# Patient Record
Sex: Female | Born: 1960 | Race: White | Hispanic: No | Marital: Married | Smoking: Current every day smoker
Health system: Southern US, Community
[De-identification: ages and names within clinical notes are randomized; demographics above are authoritative.]

## PROBLEM LIST (undated history)

## (undated) DIAGNOSIS — I2510 Atherosclerotic heart disease of native coronary artery without angina pectoris: Secondary | ICD-10-CM

## (undated) DIAGNOSIS — E039 Hypothyroidism, unspecified: Secondary | ICD-10-CM

## (undated) DIAGNOSIS — I10 Essential (primary) hypertension: Secondary | ICD-10-CM

## (undated) DIAGNOSIS — R112 Nausea with vomiting, unspecified: Secondary | ICD-10-CM

## (undated) DIAGNOSIS — I779 Disorder of arteries and arterioles, unspecified: Secondary | ICD-10-CM

## (undated) DIAGNOSIS — Z9889 Other specified postprocedural states: Secondary | ICD-10-CM

## (undated) DIAGNOSIS — E119 Type 2 diabetes mellitus without complications: Secondary | ICD-10-CM

## (undated) DIAGNOSIS — M1990 Unspecified osteoarthritis, unspecified site: Secondary | ICD-10-CM

## (undated) DIAGNOSIS — I219 Acute myocardial infarction, unspecified: Secondary | ICD-10-CM

## (undated) DIAGNOSIS — I739 Peripheral vascular disease, unspecified: Secondary | ICD-10-CM

---

## 1993-05-24 HISTORY — PX: CHOLECYSTECTOMY: SHX55

## 2000-09-23 ENCOUNTER — Other Ambulatory Visit: Admission: RE | Admit: 2000-09-23 | Discharge: 2000-09-23 | Payer: Self-pay | Admitting: Obstetrics and Gynecology

## 2000-09-30 ENCOUNTER — Other Ambulatory Visit: Admission: RE | Admit: 2000-09-30 | Discharge: 2000-09-30 | Payer: Self-pay | Admitting: Obstetrics and Gynecology

## 2000-11-03 ENCOUNTER — Inpatient Hospital Stay (HOSPITAL_COMMUNITY): Admission: RE | Admit: 2000-11-03 | Discharge: 2000-11-05 | Payer: Self-pay | Admitting: Obstetrics and Gynecology

## 2001-05-24 HISTORY — PX: ABDOMINAL HYSTERECTOMY: SHX81

## 2005-11-18 ENCOUNTER — Emergency Department (HOSPITAL_COMMUNITY): Admission: EM | Admit: 2005-11-18 | Discharge: 2005-11-18 | Payer: Self-pay | Admitting: Emergency Medicine

## 2006-03-29 ENCOUNTER — Emergency Department (HOSPITAL_COMMUNITY): Admission: EM | Admit: 2006-03-29 | Discharge: 2006-03-29 | Payer: Self-pay | Admitting: Emergency Medicine

## 2008-10-21 ENCOUNTER — Ambulatory Visit: Payer: Self-pay | Admitting: Ophthalmology

## 2009-08-20 ENCOUNTER — Encounter: Payer: Self-pay | Admitting: Emergency Medicine

## 2009-08-20 ENCOUNTER — Ambulatory Visit (HOSPITAL_COMMUNITY): Admission: RE | Admit: 2009-08-20 | Discharge: 2009-08-20 | Payer: Self-pay | Admitting: Family Medicine

## 2010-04-30 ENCOUNTER — Inpatient Hospital Stay (HOSPITAL_COMMUNITY): Admission: EM | Admit: 2010-04-30 | Discharge: 2009-08-23 | Payer: Self-pay | Admitting: Neurology

## 2010-05-24 DIAGNOSIS — I219 Acute myocardial infarction, unspecified: Secondary | ICD-10-CM

## 2010-05-24 HISTORY — DX: Acute myocardial infarction, unspecified: I21.9

## 2010-05-24 HISTORY — PX: CARDIAC CATHETERIZATION: SHX172

## 2010-06-13 ENCOUNTER — Encounter: Payer: Self-pay | Admitting: Family Medicine

## 2010-06-14 ENCOUNTER — Encounter: Payer: Self-pay | Admitting: Family Medicine

## 2010-08-12 LAB — GLUCOSE, CAPILLARY
Glucose-Capillary: 220 mg/dL — ABNORMAL HIGH (ref 70–99)
Glucose-Capillary: 293 mg/dL — ABNORMAL HIGH (ref 70–99)
Glucose-Capillary: 308 mg/dL — ABNORMAL HIGH (ref 70–99)

## 2010-08-14 LAB — BASIC METABOLIC PANEL
BUN: 15 mg/dL (ref 6–23)
CO2: 29 mEq/L (ref 19–32)
Chloride: 98 mEq/L (ref 96–112)
Creatinine, Ser: 0.72 mg/dL (ref 0.4–1.2)
Glucose, Bld: 252 mg/dL — ABNORMAL HIGH (ref 70–99)

## 2010-08-17 LAB — FERRITIN: Ferritin: 310 ng/mL — ABNORMAL HIGH (ref 10–291)

## 2010-08-17 LAB — CBC
MCHC: 33.7 g/dL (ref 30.0–36.0)
MCV: 94.7 fL (ref 78.0–100.0)
Platelets: 244 10*3/uL (ref 150–400)
RDW: 13.8 % (ref 11.5–15.5)

## 2010-08-17 LAB — FOLATE RBC: RBC Folate: 1028 ng/mL — ABNORMAL HIGH (ref 180–600)

## 2010-08-17 LAB — DIFFERENTIAL
Basophils Relative: 0 % (ref 0–1)
Eosinophils Absolute: 0.1 10*3/uL (ref 0.0–0.7)
Monocytes Relative: 7 % (ref 3–12)
Neutrophils Relative %: 69 % (ref 43–77)

## 2010-08-17 LAB — BASIC METABOLIC PANEL
BUN: 11 mg/dL (ref 6–23)
BUN: 8 mg/dL (ref 6–23)
CO2: 24 mEq/L (ref 19–32)
Chloride: 100 mEq/L (ref 96–112)
Chloride: 95 mEq/L — ABNORMAL LOW (ref 96–112)
Creatinine, Ser: 0.59 mg/dL (ref 0.4–1.2)
Glucose, Bld: 241 mg/dL — ABNORMAL HIGH (ref 70–99)
Potassium: 3.5 mEq/L (ref 3.5–5.1)

## 2010-08-17 LAB — LIPID PANEL
Cholesterol: 200 mg/dL (ref 0–200)
LDL Cholesterol: 110 mg/dL — ABNORMAL HIGH (ref 0–99)
Triglycerides: 320 mg/dL — ABNORMAL HIGH (ref <150)

## 2010-08-17 LAB — GLUCOSE, CAPILLARY
Glucose-Capillary: 214 mg/dL — ABNORMAL HIGH (ref 70–99)
Glucose-Capillary: 217 mg/dL — ABNORMAL HIGH (ref 70–99)

## 2010-08-17 LAB — PROTIME-INR
INR: 0.99 (ref 0.00–1.49)
Prothrombin Time: 13 seconds (ref 11.6–15.2)

## 2010-08-17 LAB — HEMOGLOBIN A1C
Hgb A1c MFr Bld: 12.4 % — ABNORMAL HIGH (ref 4.6–6.1)
Mean Plasma Glucose: 309 mg/dL

## 2010-08-17 LAB — IRON AND TIBC: TIBC: 279 ug/dL (ref 250–470)

## 2010-08-17 LAB — CARDIAC PANEL(CRET KIN+CKTOT+MB+TROPI): Total CK: 103 U/L (ref 7–177)

## 2011-02-25 ENCOUNTER — Inpatient Hospital Stay (HOSPITAL_COMMUNITY)
Admission: AD | Admit: 2011-02-25 | Discharge: 2011-02-26 | DRG: 853 | Disposition: A | Discharge status: Home / Self Care | Payer: BC Managed Care – PPO | Source: Other Acute Inpatient Hospital | Attending: Interventional Cardiology | Admitting: Interventional Cardiology

## 2011-02-25 DIAGNOSIS — Z79899 Other long term (current) drug therapy: Secondary | ICD-10-CM

## 2011-02-25 DIAGNOSIS — IMO0001 Reserved for inherently not codable concepts without codable children: Secondary | ICD-10-CM | POA: Diagnosis present

## 2011-02-25 DIAGNOSIS — R079 Chest pain, unspecified: Secondary | ICD-10-CM

## 2011-02-25 DIAGNOSIS — E785 Hyperlipidemia, unspecified: Secondary | ICD-10-CM | POA: Diagnosis present

## 2011-02-25 DIAGNOSIS — E039 Hypothyroidism, unspecified: Secondary | ICD-10-CM | POA: Diagnosis present

## 2011-02-25 DIAGNOSIS — F17200 Nicotine dependence, unspecified, uncomplicated: Secondary | ICD-10-CM | POA: Diagnosis present

## 2011-02-25 DIAGNOSIS — Z7982 Long term (current) use of aspirin: Secondary | ICD-10-CM

## 2011-02-25 DIAGNOSIS — E669 Obesity, unspecified: Secondary | ICD-10-CM | POA: Diagnosis present

## 2011-02-25 DIAGNOSIS — I2510 Atherosclerotic heart disease of native coronary artery without angina pectoris: Secondary | ICD-10-CM | POA: Diagnosis present

## 2011-02-25 DIAGNOSIS — I214 Non-ST elevation (NSTEMI) myocardial infarction: Principal | ICD-10-CM | POA: Diagnosis present

## 2011-02-25 LAB — GLUCOSE, CAPILLARY
Glucose-Capillary: 223 mg/dL — ABNORMAL HIGH (ref 70–99)
Glucose-Capillary: 288 mg/dL — ABNORMAL HIGH (ref 70–99)
Glucose-Capillary: 318 mg/dL — ABNORMAL HIGH (ref 70–99)

## 2011-02-25 LAB — CARDIAC PANEL(CRET KIN+CKTOT+MB+TROPI)
CK, MB: 26.4 ng/mL (ref 0.3–4.0)
CK, MB: 17.1 ng/mL (ref 0.3–4.0)
Relative Index: 7.8 — ABNORMAL HIGH (ref 0.0–2.5)
Total CK: 338 U/L — ABNORMAL HIGH (ref 7–177)
Total CK: 250 U/L — ABNORMAL HIGH (ref 7–177)
Troponin I: 11.42 ng/mL (ref <0.30)
Troponin I: 5.85 ng/mL (ref <0.30)

## 2011-02-25 LAB — CBC
HCT: 43.4 % (ref 36.0–46.0)
MCV: 87.3 fL (ref 78.0–100.0)
RDW: 12.6 % (ref 11.5–15.5)
WBC: 11.2 10*3/uL — ABNORMAL HIGH (ref 4.0–10.5)

## 2011-02-25 LAB — APTT: aPTT: 50 seconds — ABNORMAL HIGH (ref 24–37)

## 2011-02-25 LAB — COMPREHENSIVE METABOLIC PANEL
ALT: 36 U/L — ABNORMAL HIGH (ref 0–35)
AST: 46 U/L — ABNORMAL HIGH (ref 0–37)
Alkaline Phosphatase: 110 U/L (ref 39–117)
CO2: 29 mEq/L (ref 19–32)
Chloride: 104 mEq/L (ref 96–112)
Creatinine, Ser: 0.48 mg/dL — ABNORMAL LOW (ref 0.50–1.10)
GFR calc non Af Amer: >90 mL/min (ref >90)
Sodium: 139 mEq/L (ref 135–145)
Total Bilirubin: 0.3 mg/dL (ref 0.3–1.2)

## 2011-02-25 LAB — TSH: TSH: 4.668 u[IU]/mL — ABNORMAL HIGH (ref 0.350–4.500)

## 2011-02-25 LAB — POCT ACTIVATED CLOTTING TIME: Activated Clotting Time: 479 seconds

## 2011-02-25 LAB — LIPID PANEL
Cholesterol: 158 mg/dL (ref 0–200)
Triglycerides: 190 mg/dL — ABNORMAL HIGH (ref <150)

## 2011-02-26 LAB — BASIC METABOLIC PANEL
CO2: 28 mEq/L (ref 19–32)
Calcium: 8.8 mg/dL (ref 8.4–10.5)
Creatinine, Ser: 0.49 mg/dL — ABNORMAL LOW (ref 0.50–1.10)
GFR calc non Af Amer: >90 mL/min (ref >90)
Glucose, Bld: 196 mg/dL — ABNORMAL HIGH (ref 70–99)
Sodium: 139 mEq/L (ref 135–145)

## 2011-02-26 LAB — CBC
Hemoglobin: 15.9 g/dL — ABNORMAL HIGH (ref 12.0–15.0)
MCH: 31.4 pg (ref 26.0–34.0)
MCHC: 35.7 g/dL (ref 30.0–36.0)
MCV: 87.9 fL (ref 78.0–100.0)
Platelets: 207 10*3/uL (ref 150–400)
RBC: 5.06 MIL/uL (ref 3.87–5.11)

## 2011-02-28 NOTE — Cardiovascular Report (Signed)
NAMEKAILANY, DINUNZIO NO.:  [id number]  MEDICAL RECORD NO.:  [id number]  LOCATION:  2807                         FACILITY:  [facility]  PHYSICIAN:  Jake Bathe, MD      DATE OF BIRTH:  08-03-1960  DATE OF PROCEDURE:  02/25/2011 DATE OF DISCHARGE:                           CARDIAC CATHETERIZATION   PROCEDURE:  Left heart catheterization via the right radial artery approach with selective coronary angiography and left ventriculogram.  INDICATIONS:  A 50 year old female diabetic, hypertensive, hyperlipidemic, obese with family history of coronary artery disease, who presented with a non-ST-elevation myocardial infarction with troponin of 11, MB of 29, CK of 375.  LDL-cholesterol 89.  Creatinine is 0.4.  Hemoglobin is 15 with EKG showing nonspecific ST-T wave changes, currently chest pain-free.  She had chest pain intermittently for the past 3 weeks.  Chest x-ray is normal.  PROCEDURE DETAILS:  Informed consent was obtained.  Risk of stroke, heart attack, death, renal impairment, arterial damage, bleeding were explained to the patient.  Questions were answered and alternatives treatments were discussed.  Using the modified Seldinger technique, a 5- French hydrophilic sheath was inserted into the right radial artery without difficulty after 1% lidocaine was used for local anesthesia.  A 3 mg of verapamil was utilized intrasheath and once the catheter was around the arch, a 4000 units of IV heparin was administered.  Judkins right #4 catheter and Judkins left 3.5 catheter were used to selectively cannulate the coronary arteries and an angled pigtail was used to cross into the left ventricle and multiple views with hand injection of Omnipaque were obtained and left ventriculogram was performed with a power injection.  Allen test was normal pre and post procedure.  FINDINGS: 1. Left main artery branches into the LAD and circumflex artery.     There is no  angiographically significant disease present. 2. LAD - this vessel gives rise to one large diagonal branch.  After a     second septal branch, there is approximately 60% narrowing and long     stenosis in this region.  No high-grade stenosis are present. 3. Circumflex artery - there is a 99% stenosis in the proximal     circumflex artery, culprit lesion.  There are collaterals from     right-to-left to this territory.  The distal circumflex     region/obtuse marginal does have some proximal haziness and likely     stenosis as well.  We will reevaluate once stenting has been done. 4. Right coronary artery.  There is proximal vessel disease of     approximately 40-50% in this region gives rise to the PDA, which is     dominant.  There is moderate diffuse disease throughout this     vessel.  Left ventriculogram shows normal EF.  No wall motion     abnormality, no significant mitral vegetation and left ventricular     pressure is 163 with an end-diastolic pressure of 23 and aortic     pressure is 160/87 with a mean of 117 mmHg.  IMPRESSION: 1. Severe proximal circumflex 99% stenosis with right-to-left     collaterals. 2. Moderate diffuse disease elsewhere and vessels  most notably,     approximately 50-60% in the proximal/mid right coronary artery and     mid left anterior descending artery.  Normal left ventricular     ejection fraction.  PLAN:  Plan will be to perform percutaneous intervention with drug- eluting stent to the proximal circumflex.  Findings have been discussed with the patient as well as Dr. Eldridge Dace.     Jake Bathe, MD     MCS/MEDQ  D:  02/25/2011  T:  02/25/2011  Job:  [id number]  Electronically Signed by Donato Schultz MD on 02/28/2011 07:22:53 AM

## 2011-02-28 NOTE — Discharge Summary (Signed)
[name], [name] NO.:  [id number]  MEDICAL RECORD NO.:  [id number]  LOCATION:                                 FACILITY:  PHYSICIAN:  Jake Bathe, MD      DATE OF BIRTH:  [birth year]  DATE OF ADMISSION: DATE OF DISCHARGE:                              DISCHARGE SUMMARY   PRIMARY CARE PHYSICIAN:  Melvyn Novas, MD, in [place], [place].  FINAL DIAGNOSES: 1. Non-ST-elevation myocardial infarction. 2. Coronary artery disease status post drug-eluting stent to the     proximal circumflex artery via the right radial artery approach,     Promus 3.0 x 20-mm stent. 3. Uncontrolled diabetes mellitus.  Random glucoses in the 200-300     range here in the hospital.  Her hemoglobin A1c was 10.6. 4. Obesity. 5. Tobacco use - tobacco cessation counseling given. 6. Hypertension. 7. Hyperlipidemia.  PAST MEDICAL HISTORY: 1. 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 2. Depression.  DISCHARGE MEDICATIONS: 1. Glipizide XL 5 mg once a day (new). 2. Lisinopril 10 mg 1 tablet a day (new). 3. Metoprolol 25 mg twice a day (new). 4. Nitroglycerin 0.4 mg sublingual q.5 minutes chest pain (new). 5. Ticagrelor or Brilinta 90 mg tablet twice a day (new). 6. Aspirin 81 mg a day. 7. Levothyroxine 75 mg 1/2 tablet every morning. 8. Metformin 500 mg twice a day. 9. Paroxetine 20 mg 1/2 tablet a day. 10.Pravachol 40 mg every evening. 11.Vitamin D, multivitamins, and biotin.  PERTINENT LABORATORY DATA:  As above.  TSH 4.6, mildly elevated.  Total cholesterol 158, HDL 31, LDL 89, triglycerides 190.  Creatinine is 0.48. CBC shows a white count of 12.0, hemoglobin 15.9, platelet count 207.  BRIEF HOSPITAL COURSE:  Olivia [name] is a 50 year old female with diabetes - uncontrolled, hypertension, hyperlipidemia who was admitted with stuttering  chest pain over the last 3 weeks which was exertional, relieved with rest and ended up coming to the [facility] Emergency Room because of this chest discomfort.  Cardiac markers were abnormal, demonstrating a positive troponin.  Her peak troponin was 11.4 and her peak CK-MB was 29.2.  She was taken to the cardiac catheterization lab the next morning where she was found to have a 99% proximal circumflex artery stenosis which was successfully stented with a drug-eluting Promus stent.  She also had some moderate diffuse disease elsewhere, approximately 60% mid to distal LAD narrowing, long lesion, and a 40-50% proximal RCA/mid RCA lesion as well.  Her EF appeared normal.  She did very well throughout the hospitalization.  Her blood sugars were elevated.  Hemoglobin A1c was elevated and hence she was started on a new oral antihyperglycemic/glipizide.  Her blood pressure also was mildly elevated during this hospitalization, mostly in the 150s over 80s and both metoprolol as well as lisinopril were begun.  FOLLOWUP:  I have made a followup appointment to see her back in [facility] on Friday, March 05, 2011, at 9:45.  Post cath orders have been given to her.  She understands to  seek medical attention if any worrisome symptoms develop.  I have also instructed her to return to Dr. Janna [name] in [place], [place], for further antihyperglycemic control.     Jake Bathe, MD     MCS/MEDQ  D:  02/26/2011  T:  02/26/2011  Job:  [id number]  cc:   Melvyn Novas, MD  Electronically Signed by Donato Schultz MD on 02/28/2011 07:23:19 AM

## 2011-03-10 NOTE — H&P (Signed)
Olivia [name], [name] NO.:  [id number]  MEDICAL RECORD NO.:  [id number]  LOCATION:  2036                         FACILITY:  [facility]  PHYSICIAN:  Harlon Flor, MD   DATE OF BIRTH:  02-06-1961  DATE OF ADMISSION:  02/25/2011 DATE OF DISCHARGE:                             HISTORY & PHYSICAL   The patient does not have a primary cardiologist.  CHIEF COMPLAINT:  Chest pain.  HISTORY OF PRESENT ILLNESS:  Olivia [name] is a 50 year old white female with type 2 diabetes, tobacco abuse, and hyperlipidemia who is transferred from [facility] with unstable angina.  She presented there after having intermittent chest pain for the last 3 weeks.  Her chest pain is exertional and relieved with rest.  It was relieved with nitroglycerin in the emergency room there tonight.  Due to the typical nature of her symptoms, she was transferred to [facility].  Before the last 3 weeks, she has never had pain or symptoms like this before.  She does have diabetes and she takes metformin for it.  She does continue to smoke 1 pack per day.  She is currently chest pain-free and is on a heparin drip.  She has no further complaints currently.  PAST MEDICAL HISTORY: 1. Diabetes mellitus type 2, on metformin.  Hemoglobin A1c unknown. 2. Hyperlipidemia, on an unknown dose of statin. 3. Hypothyroidism. 4. History of third nerve palsy. 5. History of cholecystectomy. 6. Tobacco abuse. 7. History of hysterectomy.  HOME MEDICATIONS: 1. Metformin 500 b.i.d. 2. Synthroid 37.5 mcg daily. 3. Unknown statin, I suspect pravastatin 40 mg daily.  ALLERGIES:  No known drug allergies.  SOCIAL HISTORY:  The patient works in a factory.  She smokes 1 pack per day and has for years.  She does not drink alcohol or use any drugs.  FAMILY HISTORY:  Her father died in his 58s.  She thinks at age 3 from an acute MI.  She has siblings with diabetes.  REVIEW OF SYSTEMS:  A 14-point review of systems is  negative except as stated in the HPI.  PHYSICAL EXAMINATION:  VITAL SIGNS:  Blood pressure 121/57, pulse 91, respirations 16, temperature 36.9, O2 sat 97% on 2 liters. GENERAL:  No acute distress. HEENT:  Extraocular movements intact.  Oropharynx benign.  Nonicteric sclerae. NECK:  Supple. CARDIOVASCULAR:  Regular rate and rhythm without murmurs, rubs, or gallops.  No jugular venous distention. LUNGS:  Clear to auscultation bilaterally. ABDOMEN:  Soft, nontender, and nondistended.  No hepatosplenomegaly. EXTREMITIES:  No clubbing, cyanosis, or edema.  Pulses 2+ femoral, dorsalis pedis bilaterally. NEURO:  Grossly afocal.  Moves all extremities well.  Alert and oriented x3. SKIN:  No rashes. LYMPH:  No lymphadenopathy.  LABORATORY DATA:  Labs reviewed from the outside hospital.  Hemoglobin 17, white count 9.9, and platelets 230.  BUN 11, creatinine 0.62.  INR is 0.8.  Troponin is 0.10, CK-MB is 3.3.  Her chest x-ray from outside is reported as clear.  Her EKG shows normal sinus rhythm with nonspecific T-wave flattening throughout.  ASSESSMENT AND PLAN:  Olivia [name] is a pleasant 50 year old white female with multiple risk factors for coronary disease including uncontrolled diabetes,  tobacco abuse, and hyperlipidemia.  She is transferred from the [facility] Emergency Room with 3 weeks of intermittent typical anginal symptoms.  Her pain was relieved with nitroglycerin in the ER tonight. 1. Unstable angina:  I have a high suspicion for three-vessel disease.     We will place her on aspirin 325 as well as a heparin drip and     start her on metoprolol 25 mg b.i.d.  We will place her on Lipitor     40 mg daily and plan for left heart catheterization later today. 2. Diabetes mellitus:  She has an unknown A1c, but her blood sugar is     quite elevated now.  We will check her hemoglobin A1c, hold her     metformin.  We will start her on Lantus and sliding scale insulin. 3. We will give her  IV fluids in preparation for heart cath 100 mL/hr.     Harlon Flor, MD     MMB/MEDQ  D:  02/25/2011  T:  02/25/2011  Job:  [id number]  Electronically Signed by Meridee Score MD on 03/10/2011 11:50:08 PM

## 2011-03-22 NOTE — Cardiovascular Report (Signed)
  [name], Olivia [name] NO.:  [id number]  MEDICAL RECORD NO.:  [id number]  LOCATION:  2807                         FACILITY:  [facility]  PHYSICIAN:  Corky Crafts, MDDATE OF BIRTH:  Feb 16, 1961  DATE OF PROCEDURE:  02/25/2011 DATE OF DISCHARGE:                           CARDIAC CATHETERIZATION   PRIMARY CARDIOLOGIST:  Jake Bathe, MD.  PROCEDURES PERFORMED:  PCI of the left circumflex.  OPERATOR:  Corky Crafts, MD.  INDICATIONS:  Non-ST-elevation MI.  PROCEDURE NARRATIVE:  The diagnostic catheterization was done by Dr. Anne Fu.  This revealed 95% proximal circumflex lesion.  Angiomax was used for anticoagulation and EBU 3.0 guiding catheter was used to engage the left main which is the 5-French system.  A Prowater wire was placed across the lesion in the proximal circumflex.  A 2.0 x 15 Emerge balloon was used to predilate the lesion, inflated to 8 atmospheres.  A 3.0 x 20 Promus stent was used to stent the entire diseased area and deployed to 16 atmospheres for 35 seconds.  A 3.25 x 15 [place] Quantum Apex balloon was inflated to 18 atmospheres for two inflations.  After the postdilatation, there is a significant stenosis at the distal edge of the stent which I thought was likely due to vasospasm.  Intracoronary nitroglycerin was administered with successful resolution of the vasospasm.  In the distal obtuse marginal, there is an area of significant stenosis up to 80% but it was a very small branch.  We went back down with the 2.0 x 15 Emerge balloon and inflated this to 4 atmospheres, but there was no significant change in the vessel.  Due to concern for dissecting this vessel, we did not get any more aggressive. Additional nitroglycerin was given through the catheter.  Final pictures were taken.  The initial 95% stenosis was reduced to 0% in the proximal circumflex.  There is 80% stenosis in the distal OM branch, was remained intact.  TIMI 3 flow  maintained throughout the vessel.  IMPRESSION: 1. Non-ST-elevation MI caused by 95% proximal circumflex lesion.  This     was stented with a 3.0 x 20 Promus drug-eluting stent, postdilated     to 3.3 mm in diameter. 2. Distal OM disease in a very small vessel as noted above.  RECOMMENDATIONS:  Continue aspirin 81 mg daily and Brilinta 90 mg b.i.d. We may have to change antiplatelet agents depending on what agent is most easily available to the patient.  Continue aggressive secondary prevention.     Corky Crafts, MD     JSV/MEDQ  D:  02/25/2011  T:  02/25/2011  Job:  [id number]  cc:   Delbert Harness, MD  Electronically Signed by Lance Muss MD on 03/22/2011 01:22:53 PM

## 2012-01-31 ENCOUNTER — Ambulatory Visit (HOSPITAL_COMMUNITY)
Admission: RE | Admit: 2012-01-31 | Discharge: 2012-01-31 | Disposition: A | Discharge status: Home / Self Care | Payer: BC Managed Care – PPO | Source: Ambulatory Visit | Attending: Family Medicine | Admitting: Family Medicine

## 2012-01-31 ENCOUNTER — Other Ambulatory Visit (HOSPITAL_COMMUNITY): Payer: Self-pay | Admitting: Family Medicine

## 2012-01-31 DIAGNOSIS — M1990 Unspecified osteoarthritis, unspecified site: Secondary | ICD-10-CM

## 2012-01-31 DIAGNOSIS — M25559 Pain in unspecified hip: Secondary | ICD-10-CM | POA: Insufficient documentation

## 2012-01-31 DIAGNOSIS — M545 Low back pain, unspecified: Secondary | ICD-10-CM | POA: Insufficient documentation

## 2013-03-30 ENCOUNTER — Telehealth: Payer: Self-pay

## 2013-04-02 NOTE — Telephone Encounter (Signed)
Left message for patient to call the office in regards to refill, patient hasn't been seen in over 2 years.

## 2013-04-10 ENCOUNTER — Telehealth: Payer: Self-pay | Admitting: Cardiology

## 2013-04-10 MED ORDER — METOPROLOL TARTRATE 25 MG PO TABS: 25.0000 mg | ORAL_TABLET | Freq: Two times a day (BID) | ORAL | Status: DC

## 2013-04-10 NOTE — Telephone Encounter (Signed)
Rx filled

## 2013-07-16 ENCOUNTER — Other Ambulatory Visit (HOSPITAL_COMMUNITY): Payer: Self-pay | Admitting: Internal Medicine

## 2013-07-16 DIAGNOSIS — M5416 Radiculopathy, lumbar region: Secondary | ICD-10-CM

## 2013-07-19 ENCOUNTER — Encounter (HOSPITAL_COMMUNITY): Payer: Self-pay

## 2013-07-19 ENCOUNTER — Ambulatory Visit (HOSPITAL_COMMUNITY)
Admission: RE | Admit: 2013-07-19 | Discharge: 2013-07-19 | Disposition: A | Discharge status: Home / Self Care | Payer: BC Managed Care – PPO | Source: Ambulatory Visit | Attending: Internal Medicine | Admitting: Internal Medicine

## 2013-07-19 DIAGNOSIS — M5137 Other intervertebral disc degeneration, lumbosacral region: Secondary | ICD-10-CM | POA: Insufficient documentation

## 2013-07-19 DIAGNOSIS — M47817 Spondylosis without myelopathy or radiculopathy, lumbosacral region: Secondary | ICD-10-CM | POA: Insufficient documentation

## 2013-07-19 DIAGNOSIS — M545 Low back pain, unspecified: Secondary | ICD-10-CM | POA: Insufficient documentation

## 2013-07-19 DIAGNOSIS — M51379 Other intervertebral disc degeneration, lumbosacral region without mention of lumbar back pain or lower extremity pain: Secondary | ICD-10-CM | POA: Insufficient documentation

## 2013-07-19 DIAGNOSIS — M6281 Muscle weakness (generalized): Secondary | ICD-10-CM | POA: Insufficient documentation

## 2013-07-19 DIAGNOSIS — R209 Unspecified disturbances of skin sensation: Secondary | ICD-10-CM | POA: Insufficient documentation

## 2013-07-19 DIAGNOSIS — M5416 Radiculopathy, lumbar region: Secondary | ICD-10-CM

## 2014-05-31 ENCOUNTER — Other Ambulatory Visit (HOSPITAL_COMMUNITY): Payer: Self-pay | Admitting: Neurosurgery

## 2014-05-31 DIAGNOSIS — M4726 Other spondylosis with radiculopathy, lumbar region: Secondary | ICD-10-CM

## 2014-06-07 ENCOUNTER — Ambulatory Visit (HOSPITAL_COMMUNITY)
Admission: RE | Admit: 2014-06-07 | Discharge: 2014-06-07 | Disposition: A | Discharge status: Home / Self Care | Payer: BLUE CROSS/BLUE SHIELD | Source: Ambulatory Visit | Attending: Neurosurgery | Admitting: Neurosurgery

## 2014-06-07 DIAGNOSIS — M79604 Pain in right leg: Secondary | ICD-10-CM | POA: Insufficient documentation

## 2014-06-07 DIAGNOSIS — M4726 Other spondylosis with radiculopathy, lumbar region: Secondary | ICD-10-CM

## 2014-06-07 DIAGNOSIS — M545 Low back pain: Secondary | ICD-10-CM | POA: Diagnosis not present

## 2014-06-07 DIAGNOSIS — G8929 Other chronic pain: Secondary | ICD-10-CM | POA: Insufficient documentation

## 2014-06-13 ENCOUNTER — Other Ambulatory Visit (HOSPITAL_COMMUNITY): Payer: Self-pay | Admitting: Neurosurgery

## 2014-06-18 NOTE — Pre-Procedure Instructions (Addendum)
JOLENE KURAS  06/18/2014   Your procedure is scheduled on:  Thursday, January 28.   Report to [facility] Admitting at 11:30 AM.  Call this number if you have problems the morning of surgery: [phone]   Remember:   Do not eat food or drink liquids after midnight.   Take these medicines the morning of surgery with A SIP OF WATER: levothyroxine (SYNTHROID, LEVOTHROID), metoprolol tartrate (LOPRESSOR).               Stop Aspirin and Vitamins.   Do not wear jewelry, make-up or nail polish.  Do not wear lotions, powders, or perfumes.   Do not shave 48 hours prior to surgery.   Do not bring valuables to the hospital.             [facility] is not responsible for any belongings or valuables.               Contacts, dentures or bridgework may not be worn into surgery.  Leave suitcase in the car. After surgery it may be brought to your room.  For patients admitted to the hospital, discharge time is determined by your treatment team.               Patients discharged the day of surgery will not be allowed to drive home.  Name and phone number of your driver: -   Special Instructions: Review  [facility] - Preparing For Surgery.   Please read over the following fact sheets that you were given: Pain Booklet, Coughing and Deep Breathing and Surgical Site Infection Prevention

## 2014-06-18 NOTE — Progress Notes (Addendum)
Anesthesia PAT Evaluation:  Patient is a 54 year old female scheduled for right L5-S1 lateral microdiscectomy on 06/20/14 by Dr. Franky Macho.  PAT was this morning.  History includes CAD/NSTEMI s/p DES proximal CX 02/25/11, DM2, HTN, HLD, smoking (1ppd since age 49), depression, occluded left ICA and right carotid cavernous fistula (felt responsible for right sided ophthalmoplegia 08/21/09; a-gram ordered by neurologist Dr. Lesia Sago, but patient denies on-going neurology or carotid duplex follow-up.  Denies history of CVA). BMI is consistent with obesity.  PCP is Dr. Catalina Pizza whom she see ~ every three months.  She lives in [place]. She saw cardiologists Dr. Anne Fu with intervention by Dr. Eldridge Dace during her 02/2011 hospitalization. She has not seen Dr. Anne Fu since ~ 2013.  Meds include ASA (on hold), glipizide, metformin, metoprolol, pravastatin, Januvia.  06/19/14 EKG: NSR, possible LAE, cannot rule out anterior infarct (age undetermined).  Overall, I think her EKG appears stable when compared to tracing from 02/26/11.  02/28/11 Cardiac cath: IMPRESSION: 1. Severe proximal circumflex 99% stenosis with right-to-left  collaterals. 2. Moderate diffuse disease elsewhere and vessels most notably,  approximately 50-60% in the proximal/mid right coronary artery and  mid left anterior descending artery. Normal left ventricular  ejection fraction. Intervention: Proximal circumflex: 3.0 x 20 Promus drug-eluting stent, pos-tdilated to 3.3 mm in diameter (0% residual). 80% distal OM disease not intervened due to being a very small branch.  Preoperative labs noted.   I evaluated patient during her PAT visit. She denies SOB, chest pain, palpitations, syncope.  She has chronic LE edema, worse with prolonged standing.  Her primary symptom for her MI in 2012 was a feeling like an elephant was sitting on her chest. She denied any recurrent similar symptoms. She does continue to smoke  Her fasting  glucose readings typically run ~ 180-220.  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  She is on her feet during work and reports her work involves a lot of walking and even climbing up to three flights of stairs.  She has to move slowly and rest every few steps due to back and leg pain and numbness (RLE > LLE).  It takes her ~ 9 minutes to go up a flight of stairs at work.  Work is where she is most active currently.  She fell at home ~ 3 years ago, but activity has been more limited for ~ 7-12 months.    Exam: Vitals reviewed.  Heart RRR, no murmur noted.  Lungs clear.  I did hear bilateral carotid bruits, L > R. Mild - moderate generalized pre-tibial edema noted (stable per patient).    I reviewed above with anesthesiologist Dr. Glade Stanford.  Patient has had no CV symptoms and her EKG is stable.  However, she has known CAD with CX stent and moderate LAD and RCA disease from cath in 2012, as well as other risk factor including DM2, smoking, obesity, and carotid artery disease. She has not hade any recent cardiology testing or follow-up. She does remain on b-blocker, statin, and ASA therapies, although ASA is currently on hold.  She does continue to work 12 hour shifts which is reassuring, but her walking is in shorts spurts, so it's somewhat difficult to gauge her METS level. Dr. Sampson Goon recommended preoperative cardiology evaluation. I discussed this with patient who was quite emotional about the prospect of having to delay her surgery in order to see cardiology because of the amount of pain she has  been having. She also had to go thru a bit of trouble getting work coverage. I advised her that Darl Pikes at Dr. Sueanne Margarita office was trying to get her an urgent cardiology appointment and would contact her regarding appointment date/time. Defer any decision regarding pre-operative cardiac testing to cardiology, but she will likely need follow-up carotid studies in the near  future.    (Update:  Darl Pikes was able to get patient an appointment with Dr. Purvis Sheffield tomorrow at 8:40 AM at the [facility] office.  She is planning to have patient stay NPO after midnight and if cleared have her come to the hospital for surgery in the afternoon.  If further testing is recommended and cannot be performed prior to surgery then her procedure will be postponed.  She will contact patient with this information.)  Velna Ochs [facility] Stay [facility]/Anesthesiology Phone [phone] 06/19/2014 12:10 PM

## 2014-06-19 ENCOUNTER — Encounter (HOSPITAL_COMMUNITY)
Admission: RE | Admit: 2014-06-19 | Discharge: 2014-06-19 | Disposition: A | Discharge status: Home / Self Care | Payer: BLUE CROSS/BLUE SHIELD | Source: Ambulatory Visit | Attending: Neurosurgery | Admitting: Neurosurgery

## 2014-06-19 ENCOUNTER — Encounter (HOSPITAL_COMMUNITY): Payer: Self-pay

## 2014-06-19 DIAGNOSIS — E119 Type 2 diabetes mellitus without complications: Secondary | ICD-10-CM | POA: Diagnosis not present

## 2014-06-19 DIAGNOSIS — Z01810 Encounter for preprocedural cardiovascular examination: Secondary | ICD-10-CM | POA: Diagnosis present

## 2014-06-19 DIAGNOSIS — I2510 Atherosclerotic heart disease of native coronary artery without angina pectoris: Secondary | ICD-10-CM | POA: Diagnosis not present

## 2014-06-19 DIAGNOSIS — Z7982 Long term (current) use of aspirin: Secondary | ICD-10-CM | POA: Diagnosis not present

## 2014-06-19 HISTORY — DX: Type 2 diabetes mellitus without complications: E11.9

## 2014-06-19 HISTORY — DX: Disorder of arteries and arterioles, unspecified: I77.9

## 2014-06-19 HISTORY — DX: Acute myocardial infarction, unspecified: I21.9

## 2014-06-19 HISTORY — DX: Atherosclerotic heart disease of native coronary artery without angina pectoris: I25.10

## 2014-06-19 HISTORY — DX: Peripheral vascular disease, unspecified: I73.9

## 2014-06-19 HISTORY — DX: Essential (primary) hypertension: I10

## 2014-06-19 HISTORY — DX: Unspecified osteoarthritis, unspecified site: M19.90

## 2014-06-19 LAB — BASIC METABOLIC PANEL
ANION GAP: 11 (ref 5–15)
BUN: 8 mg/dL (ref 6–23)
CO2: 25 mmol/L (ref 19–32)
Calcium: 9.2 mg/dL (ref 8.4–10.5)
Chloride: 102 mmol/L (ref 96–112)
Creatinine, Ser: 0.57 mg/dL (ref 0.50–1.10)
GFR calc Af Amer: >90 mL/min (ref >90)
GFR calc non Af Amer: >90 mL/min (ref >90)
GLUCOSE: 242 mg/dL — AB (ref 70–99)
POTASSIUM: 4.1 mmol/L (ref 3.5–5.1)
SODIUM: 138 mmol/L (ref 135–145)

## 2014-06-19 LAB — GLUCOSE, CAPILLARY: Glucose-Capillary: 211 mg/dL — ABNORMAL HIGH (ref 70–99)

## 2014-06-19 LAB — CBC
HCT: 47 % — ABNORMAL HIGH (ref 36.0–46.0)
HEMOGLOBIN: 16.3 g/dL — AB (ref 12.0–15.0)
MCH: 30.2 pg (ref 26.0–34.0)
MCHC: 34.7 g/dL (ref 30.0–36.0)
MCV: 87.2 fL (ref 78.0–100.0)
PLATELETS: 246 10*3/uL (ref 150–400)
RBC: 5.39 MIL/uL — AB (ref 3.87–5.11)
RDW: 13.4 % (ref 11.5–15.5)
WBC: 10.2 10*3/uL (ref 4.0–10.5)

## 2014-06-19 LAB — HEMOGLOBIN A1C
Hgb A1c MFr Bld: 9.9 % — ABNORMAL HIGH (ref <5.7)
MEAN PLASMA GLUCOSE: 237 mg/dL — AB (ref <117)

## 2014-06-19 LAB — SURGICAL PCR SCREEN
MRSA, PCR: NEGATIVE
STAPHYLOCOCCUS AUREUS: NEGATIVE

## 2014-06-19 MED ORDER — CEFAZOLIN SODIUM-DEXTROSE 2-3 GM-% IV SOLR: 2.0000 g | INTRAVENOUS | Status: DC

## 2014-06-19 NOTE — Progress Notes (Signed)
   06/19/14 0927  OBSTRUCTIVE SLEEP APNEA  Have you ever been diagnosed with sleep apnea through a sleep study? No  Do you snore loudly (loud enough to be heard through closed doors)?  1  Do you often feel tired, fatigued, or sleepy during the daytime? 1  Has anyone observed you stop breathing during your sleep? 0  Do you have, or are you being treated for high blood pressure? 1  BMI more than 35 kg/m2? 0  Age over 55 years old? 1  Neck circumference greater than 40 cm/16 inches? 0  Gender: 0  Obstructive Sleep Apnea Score 4  Score 4 or greater  Results sent to PCP

## 2014-06-19 NOTE — Progress Notes (Signed)
PCP: Dr. Catalina Pizza  Pt. States she does not have a cardiologist. Last seen by Dr. Anne Fu 2012.  Loleta Books, PA notified and will see pt. Marland Kitchen

## 2014-06-19 NOTE — Progress Notes (Signed)
Left message for Dr. Franky Macho to sign orders in epic, spoke to [place].

## 2014-06-20 ENCOUNTER — Encounter: Payer: Self-pay | Admitting: *Deleted

## 2014-06-20 ENCOUNTER — Ambulatory Visit (INDEPENDENT_AMBULATORY_CARE_PROVIDER_SITE_OTHER): Payer: BLUE CROSS/BLUE SHIELD | Admitting: Cardiovascular Disease

## 2014-06-20 ENCOUNTER — Telehealth: Payer: Self-pay | Admitting: Cardiovascular Disease

## 2014-06-20 ENCOUNTER — Ambulatory Visit (HOSPITAL_COMMUNITY): Admission: RE | Admit: 2014-06-20 | Payer: BLUE CROSS/BLUE SHIELD | Source: Ambulatory Visit | Admitting: Neurosurgery

## 2014-06-20 ENCOUNTER — Encounter (HOSPITAL_COMMUNITY): Admission: RE | Payer: Self-pay | Source: Ambulatory Visit

## 2014-06-20 ENCOUNTER — Encounter: Payer: Self-pay | Admitting: Cardiovascular Disease

## 2014-06-20 VITALS — BP 164/84 | HR 96 | Ht 63.0 in | Wt 197.0 lb

## 2014-06-20 DIAGNOSIS — Z720 Tobacco use: Secondary | ICD-10-CM

## 2014-06-20 DIAGNOSIS — I2510 Atherosclerotic heart disease of native coronary artery without angina pectoris: Secondary | ICD-10-CM

## 2014-06-20 DIAGNOSIS — E1151 Type 2 diabetes mellitus with diabetic peripheral angiopathy without gangrene: Secondary | ICD-10-CM

## 2014-06-20 DIAGNOSIS — I2583 Coronary atherosclerosis due to lipid rich plaque: Secondary | ICD-10-CM

## 2014-06-20 DIAGNOSIS — Z01818 Encounter for other preprocedural examination: Secondary | ICD-10-CM

## 2014-06-20 DIAGNOSIS — I10 Essential (primary) hypertension: Secondary | ICD-10-CM

## 2014-06-20 DIAGNOSIS — E785 Hyperlipidemia, unspecified: Secondary | ICD-10-CM

## 2014-06-20 DIAGNOSIS — E1165 Type 2 diabetes mellitus with hyperglycemia: Secondary | ICD-10-CM

## 2014-06-20 DIAGNOSIS — IMO0002 Reserved for concepts with insufficient information to code with codable children: Secondary | ICD-10-CM

## 2014-06-20 DIAGNOSIS — E1159 Type 2 diabetes mellitus with other circulatory complications: Secondary | ICD-10-CM

## 2014-06-20 DIAGNOSIS — Z955 Presence of coronary angioplasty implant and graft: Secondary | ICD-10-CM

## 2014-06-20 DIAGNOSIS — I252 Old myocardial infarction: Secondary | ICD-10-CM

## 2014-06-20 SURGERY — LUMBAR LAMINECTOMY/DECOMPRESSION MICRODISCECTOMY 1 LEVEL
Anesthesia: General | Laterality: Right

## 2014-06-20 NOTE — Telephone Encounter (Signed)
Lexiscan scheduled for 06-21-14 at [facility]. Checking percert

## 2014-06-20 NOTE — Telephone Encounter (Signed)
Auth # [id number] exp 07/19/14

## 2014-06-20 NOTE — Patient Instructions (Signed)
Your physician has requested that you have a lexiscan myoview. For further information please visit [email or web address]. Please follow instruction sheet, as given. Office will contact with results via phone or letter.   Continue all current medications. Your physician wants you to follow up in: 6 months.  You will receive a reminder letter in the mail one-two months in advance.  If you don't receive a letter, please call our office to schedule the follow up appointment    

## 2014-06-20 NOTE — Progress Notes (Signed)
Patient ID: Olivia [name], female   DOB: 1961-04-03, 54 y.o.   MRN: [id number]       CARDIOLOGY CONSULT NOTE  Patient ID: Olivia [name] MRN: [id number] DOB/AGE: May 27, 1960 54 y.o.  Admit date: (Not on file) Primary Physician Catalina Pizza, MD  Reason for Consultation: pre-op risk stratification, CAD  HPI:  The patient is a 54 year old woman who is scheduled for right L5-S1 lateral microdiscectomy later this afternoon by Dr. Franky Macho. She was evaluated in the preoperative setting yesterday by anesthesiology and it was determined she needed an urgent cardiac consultation. She has a history of coronary artery disease and a non-STEMI and underwent drug-eluting stent placement to the proximal left circumflex coronary artery on 02/25/2011. She also has a medical history significant for type 2 diabetes mellitus, hypertension, hyperlipidemia, peripheral vascular disease with an occluded left internal carotid artery, and ongoing tobacco abuse. She has not followed up with cardiology in the past few years. She said she walks a significant amount at work going up and down stairs and denies exertional chest pain, shortness of breath, and dizziness. She also denies palpitations, syncope, orthopnea, paroxysmal nocturnal dyspnea. She hahad back pain for the past 2 years. She stopped taking her aspirin last Friday in preparation for surgery. She takes metoprolol and pravastatin as well. A recent ECG demonstrated normal sinus rhythm with no significant ischemic ST segment or T-wave abnormalities.  Coronary angiography on 02/25/2011 demonstrated 60% narrowing of the LAD beyond the second septal branch. She had a 99% proximal left circumflex coronary artery stenosis which was deemed the culprit vessel at the time of her MI. The right coronary artery had a proximal 40-60% stenosis. Left ventriculography demonstrated normal ejection fraction and wall motion with no significant mitral regurgitation and elevated  end-diastolic pressure of 23 mmHg.    No Known Allergies  Current Outpatient Prescriptions  Medication Sig Dispense Refill  . cholecalciferol (VITAMIN D) 1000 UNITS tablet Take 1,000 Units by mouth daily.    Marland Kitchen glipiZIDE (GLUCOTROL) 10 MG tablet Take 10 mg by mouth daily before breakfast.    . levothyroxine (SYNTHROID, LEVOTHROID) 25 MCG tablet Take 25 mcg by mouth daily before breakfast.    . metFORMIN (GLUCOPHAGE) 1000 MG tablet Take 1,000 mg by mouth 2 (two) times daily with a meal.    . metoprolol tartrate (LOPRESSOR) 25 MG tablet Take 1 tablet (25 mg total) by mouth 2 (two) times daily. 60 tablet 5  . pravastatin (PRAVACHOL) 40 MG tablet Take 40 mg by mouth daily.    . sitaGLIPtin (JANUVIA) 100 MG tablet Take 100 mg by mouth daily.    . vitamin E 400 UNIT capsule Take 400 Units by mouth daily.    Marland Kitchen aspirin EC 81 MG tablet Take 81 mg by mouth daily.     No current facility-administered medications for this visit.   Facility-Administered Medications Ordered in Other Visits  Medication Dose Route Frequency Provider Last Rate Last Dose  . ceFAZolin (ANCEF) IVPB 2 g/50 mL premix  2 g Intravenous On Call to OR Coletta Memos, MD        Past Medical History  Diagnosis Date  . Myocardial infarction 2012  . Hypertension   . Diabetes mellitus without complication   . Arthritis     back  . 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 Coronary artery disease     Past Surgical History  Procedure Laterality Date  .  Abdominal hysterectomy  2003    partial  . Cholecystectomy  1995  . Cardiac catheterization  2012    DES pCX 02/25/11    History   Social History  . Marital Status: Married    Spouse Name: N/A    Number of Children: N/A  . Years of Education: N/A   Occupational History  . Not on file.   Social History Main Topics  . Smoking status: Current Every Day Smoker -- 1.00 packs/day for 30 years    Types:  Cigarettes    Start date: 01/03/1976  . Smokeless tobacco: Never Used  . Alcohol Use: No  . Drug Use: No  . Sexual Activity: Not on file   Other Topics Concern  . Not on file   Social History Narrative     No family history of premature CAD in 1st degree relatives.  Prior to Admission medications   Medication Sig Start Date End Date Taking? Authorizing Provider  cholecalciferol (VITAMIN D) 1000 UNITS tablet Take 1,000 Units by mouth daily.   Yes Historical Provider, MD  glipiZIDE (GLUCOTROL) 10 MG tablet Take 10 mg by mouth daily before breakfast.   Yes Historical Provider, MD  levothyroxine (SYNTHROID, LEVOTHROID) 25 MCG tablet Take 25 mcg by mouth daily before breakfast.   Yes Historical Provider, MD  metFORMIN (GLUCOPHAGE) 1000 MG tablet Take 1,000 mg by mouth 2 (two) times daily with a meal.   Yes Historical Provider, MD  metoprolol tartrate (LOPRESSOR) 25 MG tablet Take 1 tablet (25 mg total) by mouth 2 (two) times daily. 04/10/13  Yes Donato Schultz, MD  pravastatin (PRAVACHOL) 40 MG tablet Take 40 mg by mouth daily.   Yes Historical Provider, MD  sitaGLIPtin (JANUVIA) 100 MG tablet Take 100 mg by mouth daily.   Yes Historical Provider, MD  vitamin E 400 UNIT capsule Take 400 Units by mouth daily.   Yes Historical Provider, MD  aspirin EC 81 MG tablet Take 81 mg by mouth daily.    Historical Provider, MD     Review of systems complete and found to be negative unless listed above in HPI     Physical exam Blood pressure 164/84, pulse 96, height  (1.6 m), weight 197 lb (89.359 kg). General: NAD, obese Neck: No JVD, no thyromegaly or thyroid nodule.  Lungs: Clear to auscultation bilaterally with normal respiratory effort. CV: Nondisplaced PMI. Regular rate and rhythm, normal S1/S2, no S3/S4, soft 1/6 systolic murmur over RUSB and LUSB.  No peripheral edema.  No carotid bruit.   Abdomen: Soft, obese, no distention.  Skin: Intact without lesions or rashes.  Neurologic:  Alert and oriented x 3.  Psych: Normal affect. Extremities: No clubbing or cyanosis.  HEENT: Normal.   ECG: Most recent ECG reviewed.  Labs:   Lab Results  Component Value Date   WBC 10.2 06/19/2014   HGB 16.3* 06/19/2014   HCT 47.0* 06/19/2014   MCV 87.2 06/19/2014   PLT 246 06/19/2014    Recent Labs Lab 06/19/14 1010  NA 138  K 4.1  CL 102  CO2 25  BUN 8  CREATININE 0.57  CALCIUM 9.2  GLUCOSE 242*   Lab Results  Component Value Date   CKTOTAL 250* 02/25/2011   CKMB 17.1* 02/25/2011   TROPONINI 5.85* 02/25/2011    Lab Results  Component Value Date   CHOL 158 02/25/2011   CHOL  08/21/2009    200        ATP III CLASSIFICATION:  <200  mg/dL   Desirable  161-096  mg/dL   Borderline High  >=045    mg/dL   High          Lab Results  Component Value Date   HDL 31* 02/25/2011   HDL 26* 08/21/2009   Lab Results  Component Value Date   LDLCALC 89 02/25/2011   LDLCALC * 08/21/2009    110        Total Cholesterol/HDL:CHD Risk Coronary Heart Disease Risk Table                     Men   Women  1/2 Average Risk   3.4   3.3  Average Risk       5.0   4.4  2 X Average Risk   9.6   7.1  3 X Average Risk  23.4   11.0        Use the calculated Patient Ratio above and the CHD Risk Table to determine the patient's CHD Risk.        ATP III CLASSIFICATION (LDL):  <100     mg/dL   Optimal  409-811  mg/dL   Near or Above                    Optimal  130-159  mg/dL   Borderline  914-782  mg/dL   High  >956     mg/dL   Very High   Lab Results  Component Value Date   TRIG 190* 02/25/2011   TRIG 320* 08/21/2009   Lab Results  Component Value Date   CHOLHDL 5.1 02/25/2011   CHOLHDL 7.7 08/21/2009   No results found for: LDLDIRECT       Studies: No results found.  ASSESSMENT AND PLAN:  1. Preoperative risk stratification: While it is encouraging that her ECG does not demonstrate any significant ischemic ST segment or T-wave abnormalities, nor has she  had any significant exertional symptoms over the past few years, is difficult to assess her functional capacity. She also has multiple cardiac risk factors including hypertension, hyperlipidemia, obesity, uncontrolled diabetes mellitus (HbA1C 9.9% on 1/27), and ongoing tobacco abuse. She is at the very least at a moderate risk for a major adverse cardiac event in the perioperative period. I cannot say with certainty whether she is at high risk as I do not know if the LAD or RCA lesions have progressed to a large degree. I have recommended that she undergo nuclear stress testing to evaluate for ischemia to more accurately quantify this risk. While she is very upset about having to delay her surgery, she would rather undergo preoperative cardiac testing. I will arrange for this to be performed in the very near future.  2. Essential HTN: Elevated, and likely due to pain and not taking her medications in preparation for surgery. Will monitor.  3. Hyperlipidemia: On pravastatin 40 mg. Will obtain copy of lipids from PCP's office.  4. CAD with h/o left circumflex coronary artery stent: As per #1. Obtain Lexiscan to assess for ischemia. Continue ASA, beta blocker, and statin.  5. Diabetes mellitus: Uncontrolled with HbA1C 9.9% on 1/27. Managed by PCP.  6. Tobacco abuse: Cessation counseling provided.  Dispo: f/u 6 months. Will provide preoperative risk once nuclear stress testing has been completed.  Signed: Prentice Docker, M.D., F.A.C.C.  06/20/2014, 8:46 AM

## 2014-06-21 ENCOUNTER — Encounter (HOSPITAL_COMMUNITY)
Admission: RE | Admit: 2014-06-21 | Discharge: 2014-06-21 | Disposition: A | Discharge status: Home / Self Care | Payer: BLUE CROSS/BLUE SHIELD | Source: Ambulatory Visit | Attending: Cardiovascular Disease | Admitting: Cardiovascular Disease

## 2014-06-21 ENCOUNTER — Ambulatory Visit (HOSPITAL_COMMUNITY)
Admission: RE | Admit: 2014-06-21 | Discharge: 2014-06-21 | Disposition: A | Discharge status: Home / Self Care | Payer: BLUE CROSS/BLUE SHIELD | Source: Ambulatory Visit | Attending: Cardiovascular Disease | Admitting: Cardiovascular Disease

## 2014-06-21 ENCOUNTER — Encounter (HOSPITAL_COMMUNITY): Payer: Self-pay

## 2014-06-21 DIAGNOSIS — E119 Type 2 diabetes mellitus without complications: Secondary | ICD-10-CM | POA: Insufficient documentation

## 2014-06-21 DIAGNOSIS — I2583 Coronary atherosclerosis due to lipid rich plaque: Secondary | ICD-10-CM

## 2014-06-21 DIAGNOSIS — I2510 Atherosclerotic heart disease of native coronary artery without angina pectoris: Secondary | ICD-10-CM | POA: Insufficient documentation

## 2014-06-21 DIAGNOSIS — Z7982 Long term (current) use of aspirin: Secondary | ICD-10-CM | POA: Insufficient documentation

## 2014-06-21 DIAGNOSIS — Z01810 Encounter for preprocedural cardiovascular examination: Secondary | ICD-10-CM | POA: Insufficient documentation

## 2014-06-21 DIAGNOSIS — Z01818 Encounter for other preprocedural examination: Secondary | ICD-10-CM | POA: Insufficient documentation

## 2014-06-21 MED ORDER — TECHNETIUM TC 99M SESTAMIBI GENERIC - CARDIOLITE
10.0000 | Freq: Once | INTRAVENOUS | Status: AC | PRN
  Administered 2014-06-21: 10 via INTRAVENOUS

## 2014-06-21 MED ORDER — SODIUM CHLORIDE 0.9 % IJ SOLN
10.0000 mL | INTRAMUSCULAR | Status: DC | PRN
  Administered 2014-06-21: 10 mL via INTRAVENOUS
  Filled 2014-06-21: qty 10

## 2014-06-21 MED ORDER — SODIUM CHLORIDE 0.9 % IJ SOLN
INTRAMUSCULAR | Status: AC
  Administered 2014-06-21: 10 mL via INTRAVENOUS
  Filled 2014-06-21: qty 3

## 2014-06-21 MED ORDER — REGADENOSON 0.4 MG/5ML IV SOLN
0.4000 mg | Freq: Once | INTRAVENOUS | Status: AC | PRN
  Administered 2014-06-21: 0.4 mg via INTRAVENOUS

## 2014-06-21 MED ORDER — REGADENOSON 0.4 MG/5ML IV SOLN
INTRAVENOUS | Status: AC
  Administered 2014-06-21: 0.4 mg via INTRAVENOUS
  Filled 2014-06-21: qty 5

## 2014-06-21 MED ORDER — TECHNETIUM TC 99M SESTAMIBI - CARDIOLITE
30.0000 | Freq: Once | INTRAVENOUS | Status: AC | PRN
  Administered 2014-06-21: 11:00:00 30 via INTRAVENOUS

## 2014-06-21 NOTE — Progress Notes (Signed)
Stress Lab Nurses Notes - Olivia [name]  Olivia [name] 06/21/2014 Reason for doing test: CAD and Surgical Clearance Type of test: Marlane Hatcher Nurse performing test: Parke Poisson, RN Nuclear Medicine Tech: Marcella Dubs Echo Tech: Not Applicable MD performing test: Branch/K.Lyman Bishop NP Family MD: Margo Aye Test explained and consent signed: Yes.   IV started: Saline lock flushed, No redness or edema and Saline lock started in radiology Symptoms: headache Treatment/Intervention: None Reason test stopped: protocol completed After recovery IV was: Discontinued via X-ray tech and No redness or edema Patient to return to Nuc. Med at : 12:00 Patient discharged: Home Patient's Condition upon discharge was: stable Comments: During test BP 141/84 & HR 105 . Recovery BP 133/76 & HR 101.  Symptoms resolved in recovery. Erskine Speed T

## 2014-06-24 ENCOUNTER — Telehealth: Payer: Self-pay | Admitting: *Deleted

## 2014-06-24 NOTE — Telephone Encounter (Signed)
-----   Message from Laqueta Linden, MD sent at 06/21/2014  2:10 PM EST ----- Normal.

## 2014-06-24 NOTE — Telephone Encounter (Signed)
Notes Recorded by Lesle Chris, LPN on 11/26/1023 at 1:32 PM Left message to return call.

## 2014-06-25 NOTE — Telephone Encounter (Signed)
Notes Recorded by Lesle Chris, LPN on 12/26/5362 at 8:47 AM Patient notified. Results forwarded to PMD & Dr. Franky Macho.

## 2014-06-26 ENCOUNTER — Other Ambulatory Visit (HOSPITAL_COMMUNITY): Payer: Self-pay | Admitting: Neurosurgery

## 2014-06-27 NOTE — Progress Notes (Signed)
Anesthesia follow-up: See my note from 06/19/14.  Since then she was seen by cardiologist Dr. Purvis Sheffield and had a low risk stress test.  Surgery is rescheduled for 06/28/14.  Nuclear stress test 06/21/14:  IMPRESSION: 1. No reversible ischemia or infarction. 2. Normal left ventricular wall motion. 3. Left ventricular ejection fraction 75% 4. Low-risk stress test findings*.  Olivia [name] [facility]/Anesthesiology Phone [phone] 06/27/2014 9:50 AM

## 2014-06-28 ENCOUNTER — Encounter (HOSPITAL_COMMUNITY)
Admission: RE | Disposition: A | Discharge status: Home / Self Care | Payer: Self-pay | Source: Ambulatory Visit | Attending: Neurosurgery

## 2014-06-28 ENCOUNTER — Ambulatory Visit (HOSPITAL_COMMUNITY): Payer: BLUE CROSS/BLUE SHIELD | Admitting: Vascular Surgery

## 2014-06-28 ENCOUNTER — Other Ambulatory Visit (HOSPITAL_COMMUNITY): Payer: BLUE CROSS/BLUE SHIELD

## 2014-06-28 ENCOUNTER — Encounter (HOSPITAL_COMMUNITY): Payer: Self-pay | Admitting: *Deleted

## 2014-06-28 ENCOUNTER — Ambulatory Visit (HOSPITAL_COMMUNITY)
Admission: RE | Admit: 2014-06-28 | Discharge: 2014-06-28 | Disposition: A | Discharge status: Home / Self Care | Payer: BLUE CROSS/BLUE SHIELD | Source: Ambulatory Visit | Attending: Neurosurgery | Admitting: Neurosurgery

## 2014-06-28 ENCOUNTER — Ambulatory Visit (HOSPITAL_COMMUNITY): Payer: BLUE CROSS/BLUE SHIELD

## 2014-06-28 DIAGNOSIS — I252 Old myocardial infarction: Secondary | ICD-10-CM | POA: Diagnosis not present

## 2014-06-28 DIAGNOSIS — I2510 Atherosclerotic heart disease of native coronary artery without angina pectoris: Secondary | ICD-10-CM | POA: Diagnosis not present

## 2014-06-28 DIAGNOSIS — Z7982 Long term (current) use of aspirin: Secondary | ICD-10-CM | POA: Diagnosis not present

## 2014-06-28 DIAGNOSIS — M5126 Other intervertebral disc displacement, lumbar region: Secondary | ICD-10-CM | POA: Diagnosis present

## 2014-06-28 DIAGNOSIS — I10 Essential (primary) hypertension: Secondary | ICD-10-CM | POA: Diagnosis not present

## 2014-06-28 DIAGNOSIS — Z79899 Other long term (current) drug therapy: Secondary | ICD-10-CM | POA: Diagnosis not present

## 2014-06-28 DIAGNOSIS — M479 Spondylosis, unspecified: Secondary | ICD-10-CM | POA: Insufficient documentation

## 2014-06-28 DIAGNOSIS — M1990 Unspecified osteoarthritis, unspecified site: Secondary | ICD-10-CM

## 2014-06-28 DIAGNOSIS — E119 Type 2 diabetes mellitus without complications: Secondary | ICD-10-CM | POA: Insufficient documentation

## 2014-06-28 HISTORY — PX: LUMBAR LAMINECTOMY/DECOMPRESSION MICRODISCECTOMY: SHX5026

## 2014-06-28 LAB — GLUCOSE, CAPILLARY
GLUCOSE-CAPILLARY: 196 mg/dL — AB (ref 70–99)
GLUCOSE-CAPILLARY: 254 mg/dL — AB (ref 70–99)

## 2014-06-28 SURGERY — LUMBAR LAMINECTOMY/DECOMPRESSION MICRODISCECTOMY 1 LEVEL
Anesthesia: General | Site: Back | Laterality: Right

## 2014-06-28 MED ORDER — PROPOFOL 10 MG/ML IV BOLUS
INTRAVENOUS | Status: DC | PRN
  Administered 2014-06-28: 150 mg via INTRAVENOUS

## 2014-06-28 MED ORDER — LIDOCAINE HCL (CARDIAC) 20 MG/ML IV SOLN
INTRAVENOUS | Status: DC | PRN
  Administered 2014-06-28: 20 mg via INTRAVENOUS

## 2014-06-28 MED ORDER — GLIPIZIDE 10 MG PO TABS
10.0000 mg | ORAL_TABLET | Freq: Every day | ORAL | Status: DC
  Filled 2014-06-28: qty 1

## 2014-06-28 MED ORDER — SENNA 8.6 MG PO TABS: 1.0000 | ORAL_TABLET | Freq: Two times a day (BID) | ORAL | Status: DC

## 2014-06-28 MED ORDER — METOPROLOL TARTRATE 12.5 MG HALF TABLET
ORAL_TABLET | ORAL | Status: AC
  Filled 2014-06-28: qty 2

## 2014-06-28 MED ORDER — METFORMIN HCL 500 MG PO TABS
1000.0000 mg | ORAL_TABLET | Freq: Two times a day (BID) | ORAL | Status: DC
Start: 2014-06-29 — End: 2014-06-29
  Filled 2014-06-28: qty 2

## 2014-06-28 MED ORDER — ARTIFICIAL TEARS OP OINT
TOPICAL_OINTMENT | OPHTHALMIC | Status: AC
  Filled 2014-06-28: qty 3.5

## 2014-06-28 MED ORDER — EPHEDRINE SULFATE 50 MG/ML IJ SOLN
INTRAMUSCULAR | Status: DC | PRN
  Administered 2014-06-28: 5 mg via INTRAVENOUS

## 2014-06-28 MED ORDER — DEXAMETHASONE SODIUM PHOSPHATE 4 MG/ML IJ SOLN
INTRAMUSCULAR | Status: AC
  Filled 2014-06-28: qty 2

## 2014-06-28 MED ORDER — MORPHINE SULFATE 2 MG/ML IJ SOLN: 1.0000 mg | INTRAMUSCULAR | Status: DC | PRN

## 2014-06-28 MED ORDER — SUCCINYLCHOLINE CHLORIDE 20 MG/ML IJ SOLN
INTRAMUSCULAR | Status: AC
  Filled 2014-06-28: qty 1

## 2014-06-28 MED ORDER — MEPERIDINE HCL 25 MG/ML IJ SOLN: 6.2500 mg | INTRAMUSCULAR | Status: DC | PRN

## 2014-06-28 MED ORDER — ACETAMINOPHEN 325 MG PO TABS
650.0000 mg | ORAL_TABLET | ORAL | Status: DC | PRN
Start: 2014-06-28 — End: 2014-06-29

## 2014-06-28 MED ORDER — MIDAZOLAM HCL 5 MG/5ML IJ SOLN
INTRAMUSCULAR | Status: DC | PRN
  Administered 2014-06-28: 2 mg via INTRAVENOUS

## 2014-06-28 MED ORDER — METOPROLOL TARTRATE 25 MG PO TABS
25.0000 mg | ORAL_TABLET | Freq: Once | ORAL | Status: AC
  Administered 2014-06-28: 25 mg via ORAL

## 2014-06-28 MED ORDER — POTASSIUM CHLORIDE IN NACL 20-0.9 MEQ/L-% IV SOLN
INTRAVENOUS | Status: DC
  Filled 2014-06-28: qty 1000

## 2014-06-28 MED ORDER — DEXAMETHASONE SODIUM PHOSPHATE 4 MG/ML IJ SOLN
INTRAMUSCULAR | Status: DC | PRN
  Administered 2014-06-28: 8 mg via INTRAVENOUS

## 2014-06-28 MED ORDER — LIDOCAINE-EPINEPHRINE 0.5 %-1:200000 IJ SOLN
INTRAMUSCULAR | Status: DC | PRN
Start: 2014-06-28 — End: 2014-06-28
  Administered 2014-06-28: 10 mL

## 2014-06-28 MED ORDER — MIDAZOLAM HCL 2 MG/2ML IJ SOLN
INTRAMUSCULAR | Status: AC
  Filled 2014-06-28: qty 2

## 2014-06-28 MED ORDER — FENTANYL CITRATE 0.05 MG/ML IJ SOLN
INTRAMUSCULAR | Status: DC | PRN
  Administered 2014-06-28: 50 ug via INTRAVENOUS

## 2014-06-28 MED ORDER — HYDROMORPHONE HCL 1 MG/ML IJ SOLN: 0.2500 mg | INTRAMUSCULAR | Status: DC | PRN

## 2014-06-28 MED ORDER — SODIUM CHLORIDE 0.9 % IJ SOLN
INTRAMUSCULAR | Status: AC
Start: 2014-06-28 — End: 2014-06-28
  Filled 2014-06-28: qty 10

## 2014-06-28 MED ORDER — MIDAZOLAM HCL 2 MG/2ML IJ SOLN: 0.5000 mg | Freq: Once | INTRAMUSCULAR | Status: DC | PRN

## 2014-06-28 MED ORDER — ROCURONIUM BROMIDE 100 MG/10ML IV SOLN
INTRAVENOUS | Status: DC | PRN
  Administered 2014-06-28: 50 mg via INTRAVENOUS

## 2014-06-28 MED ORDER — OXYCODONE-ACETAMINOPHEN 5-325 MG PO TABS: 1.0000 | ORAL_TABLET | ORAL | Status: DC | PRN

## 2014-06-28 MED ORDER — MENTHOL 3 MG MT LOZG: 1.0000 | LOZENGE | OROMUCOSAL | Status: DC | PRN

## 2014-06-28 MED ORDER — CEFAZOLIN SODIUM-DEXTROSE 2-3 GM-% IV SOLR
INTRAVENOUS | Status: DC | PRN
  Administered 2014-06-28: 2 g via INTRAVENOUS

## 2014-06-28 MED ORDER — FENTANYL CITRATE 0.05 MG/ML IJ SOLN
INTRAMUSCULAR | Status: AC
  Filled 2014-06-28: qty 5

## 2014-06-28 MED ORDER — TRAMADOL HCL 50 MG PO TABS: 50.0000 mg | ORAL_TABLET | Freq: Four times a day (QID) | ORAL | Status: DC | PRN

## 2014-06-28 MED ORDER — GLYCOPYRROLATE 0.2 MG/ML IJ SOLN
INTRAMUSCULAR | Status: DC | PRN
  Administered 2014-06-28: 0.6 mg via INTRAVENOUS

## 2014-06-28 MED ORDER — SODIUM CHLORIDE 0.9 % IJ SOLN: 3.0000 mL | INTRAMUSCULAR | Status: DC | PRN

## 2014-06-28 MED ORDER — PROMETHAZINE HCL 25 MG/ML IJ SOLN
INTRAMUSCULAR | Status: AC
  Filled 2014-06-28: qty 1

## 2014-06-28 MED ORDER — PHENYLEPHRINE 40 MCG/ML (10ML) SYRINGE FOR IV PUSH (FOR BLOOD PRESSURE SUPPORT)
PREFILLED_SYRINGE | INTRAVENOUS | Status: AC
  Filled 2014-06-28: qty 10

## 2014-06-28 MED ORDER — ONDANSETRON HCL 4 MG/2ML IJ SOLN
INTRAMUSCULAR | Status: AC
  Filled 2014-06-28: qty 2

## 2014-06-28 MED ORDER — HEMOSTATIC AGENTS (NO CHARGE) OPTIME
TOPICAL | Status: DC | PRN
  Administered 2014-06-28: 1 via TOPICAL

## 2014-06-28 MED ORDER — PROMETHAZINE HCL 25 MG/ML IJ SOLN
6.2500 mg | INTRAMUSCULAR | Status: DC | PRN
  Administered 2014-06-28: 6.25 mg via INTRAVENOUS

## 2014-06-28 MED ORDER — LEVOTHYROXINE SODIUM 25 MCG PO TABS
25.0000 ug | ORAL_TABLET | Freq: Every day | ORAL | Status: DC
  Filled 2014-06-28: qty 1

## 2014-06-28 MED ORDER — NEOSTIGMINE METHYLSULFATE 10 MG/10ML IV SOLN
INTRAVENOUS | Status: DC | PRN
  Administered 2014-06-28: 4 mg via INTRAVENOUS

## 2014-06-28 MED ORDER — LINAGLIPTIN 5 MG PO TABS
5.0000 mg | ORAL_TABLET | Freq: Every day | ORAL | Status: DC
  Filled 2014-06-28: qty 1

## 2014-06-28 MED ORDER — FENTANYL CITRATE 0.05 MG/ML IJ SOLN
INTRAMUSCULAR | Status: AC
  Filled 2014-06-28: qty 2

## 2014-06-28 MED ORDER — HYDROCODONE-ACETAMINOPHEN 5-325 MG PO TABS: 1.0000 | ORAL_TABLET | ORAL | Status: DC | PRN

## 2014-06-28 MED ORDER — LACTATED RINGERS IV SOLN
INTRAVENOUS | Status: DC | PRN
  Administered 2014-06-28 (×2): via INTRAVENOUS

## 2014-06-28 MED ORDER — CYCLOBENZAPRINE HCL 10 MG PO TABS: 10.0000 mg | ORAL_TABLET | Freq: Three times a day (TID) | ORAL | Status: DC | PRN

## 2014-06-28 MED ORDER — LIDOCAINE HCL (CARDIAC) 20 MG/ML IV SOLN
INTRAVENOUS | Status: AC
  Filled 2014-06-28: qty 15

## 2014-06-28 MED ORDER — EPHEDRINE SULFATE 50 MG/ML IJ SOLN
INTRAMUSCULAR | Status: AC
  Filled 2014-06-28: qty 1

## 2014-06-28 MED ORDER — CEFAZOLIN SODIUM-DEXTROSE 2-3 GM-% IV SOLR
INTRAVENOUS | Status: AC
  Filled 2014-06-28: qty 50

## 2014-06-28 MED ORDER — ONDANSETRON HCL 4 MG/2ML IJ SOLN: 4.0000 mg | INTRAMUSCULAR | Status: DC | PRN

## 2014-06-28 MED ORDER — THROMBIN 5000 UNITS EX SOLR
CUTANEOUS | Status: DC | PRN
  Administered 2014-06-28 (×2): 5000 [IU] via TOPICAL

## 2014-06-28 MED ORDER — METOPROLOL TARTRATE 25 MG PO TABS
25.0000 mg | ORAL_TABLET | Freq: Two times a day (BID) | ORAL | Status: DC
  Filled 2014-06-28: qty 1

## 2014-06-28 MED ORDER — INSULIN ASPART 100 UNIT/ML ~~LOC~~ SOLN: 0.0000 [IU] | Freq: Three times a day (TID) | SUBCUTANEOUS | Status: DC

## 2014-06-28 MED ORDER — INSULIN ASPART 100 UNIT/ML ~~LOC~~ SOLN: 0.0000 [IU] | Freq: Every day | SUBCUTANEOUS | Status: DC

## 2014-06-28 MED ORDER — SODIUM CHLORIDE 0.9 % IJ SOLN: 3.0000 mL | Freq: Two times a day (BID) | INTRAMUSCULAR | Status: DC

## 2014-06-28 MED ORDER — ACETAMINOPHEN 650 MG RE SUPP: 650.0000 mg | RECTAL | Status: DC | PRN

## 2014-06-28 MED ORDER — ROCURONIUM BROMIDE 50 MG/5ML IV SOLN
INTRAVENOUS | Status: AC
  Filled 2014-06-28: qty 1

## 2014-06-28 MED ORDER — FENTANYL CITRATE 0.05 MG/ML IJ SOLN
INTRAMUSCULAR | Status: DC | PRN
  Administered 2014-06-28: 100 ug via INTRAVENOUS
  Administered 2014-06-28 (×2): 50 ug via INTRAVENOUS
  Administered 2014-06-28: 200 ug via INTRAVENOUS

## 2014-06-28 MED ORDER — PHENOL 1.4 % MT LIQD: 1.0000 | OROMUCOSAL | Status: DC | PRN

## 2014-06-28 MED ORDER — PHENYLEPHRINE HCL 10 MG/ML IJ SOLN
INTRAMUSCULAR | Status: DC | PRN
  Administered 2014-06-28 (×2): 80 ug via INTRAVENOUS

## 2014-06-28 MED ORDER — PRAVASTATIN SODIUM 40 MG PO TABS
40.0000 mg | ORAL_TABLET | Freq: Every day | ORAL | Status: DC
  Filled 2014-06-28: qty 1

## 2014-06-28 MED ORDER — VITAMIN D3 25 MCG (1000 UNIT) PO TABS
1000.0000 [IU] | ORAL_TABLET | Freq: Every day | ORAL | Status: DC
  Filled 2014-06-28: qty 1

## 2014-06-28 MED ORDER — METHYLPREDNISOLONE ACETATE 80 MG/ML IJ SUSP
INTRAMUSCULAR | Status: DC | PRN
  Administered 2014-06-28: 80 mg

## 2014-06-28 MED ORDER — LACTATED RINGERS IV SOLN
INTRAVENOUS | Status: DC
  Administered 2014-06-28: 13:00:00 via INTRAVENOUS

## 2014-06-28 MED ORDER — ARTIFICIAL TEARS OP OINT
TOPICAL_OINTMENT | OPHTHALMIC | Status: DC | PRN
  Administered 2014-06-28: 1 via OPHTHALMIC

## 2014-06-28 MED ORDER — ONDANSETRON HCL 4 MG/2ML IJ SOLN
INTRAMUSCULAR | Status: DC | PRN
  Administered 2014-06-28 (×2): 4 mg via INTRAVENOUS

## 2014-06-28 MED ORDER — KETOROLAC TROMETHAMINE 30 MG/ML IJ SOLN: 30.0000 mg | Freq: Four times a day (QID) | INTRAMUSCULAR | Status: DC

## 2014-06-28 MED ORDER — DIAZEPAM 5 MG PO TABS: 5.0000 mg | ORAL_TABLET | Freq: Four times a day (QID) | ORAL | Status: DC | PRN

## 2014-06-28 MED ORDER — 0.9 % SODIUM CHLORIDE (POUR BTL) OPTIME
TOPICAL | Status: DC | PRN
  Administered 2014-06-28: 1000 mL

## 2014-06-28 SURGICAL SUPPLY — 55 items
APL SKNCLS STERI-STRIP NONHPOA (GAUZE/BANDAGES/DRESSINGS)
BAG DECANTER FOR FLEXI CONT (MISCELLANEOUS) ×3 IMPLANT
BENZOIN TINCTURE PRP APPL 2/3 (GAUZE/BANDAGES/DRESSINGS) IMPLANT
BLADE CLIPPER SURG (BLADE) IMPLANT
BUR MATCHSTICK NEURO 3.0 LAGG (BURR) ×3 IMPLANT
CANISTER SUCT 3000ML (MISCELLANEOUS) ×3 IMPLANT
CLOSURE WOUND 1/2 X4 (GAUZE/BANDAGES/DRESSINGS)
CONT SPEC 4OZ CLIKSEAL STRL BL (MISCELLANEOUS) ×3 IMPLANT
DECANTER SPIKE VIAL GLASS SM (MISCELLANEOUS) ×3 IMPLANT
DRAPE LAPAROTOMY 100X72X124 (DRAPES) ×3 IMPLANT
DRAPE MICROSCOPE LEICA (MISCELLANEOUS) ×3 IMPLANT
DRAPE POUCH INSTRU U-SHP 10X18 (DRAPES) ×3 IMPLANT
DRAPE SURG 17X23 STRL (DRAPES) ×3 IMPLANT
DURAPREP 26ML APPLICATOR (WOUND CARE) ×3 IMPLANT
ELECT BLADE 4.0 EZ CLEAN MEGAD (MISCELLANEOUS) ×3
ELECT REM PT RETURN 9FT ADLT (ELECTROSURGICAL) ×3
ELECTRODE BLDE 4.0 EZ CLN MEGD (MISCELLANEOUS) IMPLANT
ELECTRODE REM PT RTRN 9FT ADLT (ELECTROSURGICAL) ×1 IMPLANT
GAUZE SPONGE 4X4 12PLY STRL (GAUZE/BANDAGES/DRESSINGS) IMPLANT
GAUZE SPONGE 4X4 16PLY XRAY LF (GAUZE/BANDAGES/DRESSINGS) IMPLANT
GLOVE BIOGEL PI IND STRL 8.5 (GLOVE) IMPLANT
GLOVE BIOGEL PI INDICATOR 8.5 (GLOVE) ×2
GLOVE ECLIPSE 6.5 STRL STRAW (GLOVE) ×5 IMPLANT
GLOVE ECLIPSE 8.5 STRL (GLOVE) ×2 IMPLANT
GLOVE EXAM NITRILE LRG STRL (GLOVE) IMPLANT
GLOVE EXAM NITRILE MD LF STRL (GLOVE) IMPLANT
GLOVE EXAM NITRILE XL STR (GLOVE) IMPLANT
GLOVE EXAM NITRILE XS STR PU (GLOVE) IMPLANT
GOWN STRL REUS W/ TWL LRG LVL3 (GOWN DISPOSABLE) ×2 IMPLANT
GOWN STRL REUS W/ TWL XL LVL3 (GOWN DISPOSABLE) IMPLANT
GOWN STRL REUS W/TWL 2XL LVL3 (GOWN DISPOSABLE) ×2 IMPLANT
GOWN STRL REUS W/TWL LRG LVL3 (GOWN DISPOSABLE) ×6
GOWN STRL REUS W/TWL XL LVL3 (GOWN DISPOSABLE)
KIT BASIN OR (CUSTOM PROCEDURE TRAY) ×3 IMPLANT
KIT ROOM TURNOVER OR (KITS) ×3 IMPLANT
LIQUID BAND (GAUZE/BANDAGES/DRESSINGS) ×3 IMPLANT
NDL HYPO 25X1 1.5 SAFETY (NEEDLE) ×1 IMPLANT
NDL SPNL 18GX3.5 QUINCKE PK (NEEDLE) IMPLANT
NEEDLE HYPO 25X1 1.5 SAFETY (NEEDLE) ×3 IMPLANT
NEEDLE SPNL 18GX3.5 QUINCKE PK (NEEDLE) IMPLANT
NS IRRIG 1000ML POUR BTL (IV SOLUTION) ×3 IMPLANT
PACK LAMINECTOMY NEURO (CUSTOM PROCEDURE TRAY) ×3 IMPLANT
PAD ARMBOARD 7.5X6 YLW CONV (MISCELLANEOUS) ×9 IMPLANT
RUBBERBAND STERILE (MISCELLANEOUS) ×6 IMPLANT
SPONGE LAP 4X18 X RAY DECT (DISPOSABLE) IMPLANT
SPONGE SURGIFOAM ABS GEL SZ50 (HEMOSTASIS) ×3 IMPLANT
STRIP CLOSURE SKIN 1/2X4 (GAUZE/BANDAGES/DRESSINGS) IMPLANT
SUT VIC AB 0 CT1 18XCR BRD8 (SUTURE) ×1 IMPLANT
SUT VIC AB 0 CT1 8-18 (SUTURE) ×3
SUT VIC AB 2-0 CT1 18 (SUTURE) ×3 IMPLANT
SUT VIC AB 3-0 SH 8-18 (SUTURE) ×3 IMPLANT
SYR 20ML ECCENTRIC (SYRINGE) ×3 IMPLANT
TOWEL OR 17X24 6PK STRL BLUE (TOWEL DISPOSABLE) ×3 IMPLANT
TOWEL OR 17X26 10 PK STRL BLUE (TOWEL DISPOSABLE) ×3 IMPLANT
WATER STERILE IRR 1000ML POUR (IV SOLUTION) ×3 IMPLANT

## 2014-06-28 NOTE — Op Note (Signed)
06/28/2014  5:42 PM  PATIENT:  Olivia [name]  54 y.o. female  PRE-OPERATIVE DIAGNOSIS:  lumbar herniated disc right L5/S1  POST-OPERATIVE DIAGNOSIS:  lumbar herniated disc right L5/S1  PROCEDURE:  Procedure(s):far lateral right L5/S1 LUMBAR LAMINECTOMY/DECOMPRESSION MICRODISCECTOMY 1 LEVEL  SURGEON:   Surgeon(s): Coletta Memos, MD  ASSISTANTS:Elsner, Sherilyn Cooter  ANESTHESIA:   general  EBL:  Total I/O In: 1000 [I.V.:1000] Out: -   BLOOD ADMINISTERED:none  CELL SAVER GIVEN:none  COUNT:per nursing  DRAINS: none   SPECIMEN:  No Specimen  DICTATION: [name] was taken to the operating room, intubated and placed under a general anesthetic without difficulty. She was positioned prone on a Wilson frame with all pressure points padded. Her back was prepped and draped in a sterile manner. I opened the skin with a 10 blade and carried the dissection down to the thoracolumbar fascia. I used both sharp dissection and the monopolar cautery to expose the lamina of L5, and L4. I confirmed my location with an intraoperative xray.  I used the drill, Kerrison punches, and curettes to drill the lateral aspect of the  L5 pars. I used the punches to remove the ligamentum flavum to expose the right L5 root extraforaminally. I brought the microscope into the operative field and with Dr.Elsner's assistance we started our decompression of the  L5 root(s).I performed a partial facetectomy of the superior S1 and Inferior L5 facet to expose the root. Dr. Danielle Dess opened the disc space with a 15 blade and we proceeded with the discectomy.We used pituitary rongeurs, curettes, and other instruments to remove disc material. After the discectomy was completed we inspected the L5 nerve root and felt it was well decompressed. I explored rostrally, laterally, medially, and caudally and was satisfied with the decompression. I placed solumedrol and fentanyl  I irrigated the wound, then closed in layers. I approximated the  thoracolumbar fascia, subcutaneous, and subcuticular planes with vicryl sutures. I used dermabond for a sterile dressing.   PLAN OF CARE: Admit for overnight observation  PATIENT DISPOSITION:  PACU - hemodynamically stable.   Delay start of Pharmacological VTE agent (>24hrs) due to surgical blood loss or risk of bleeding:  yes

## 2014-06-28 NOTE — Anesthesia Postprocedure Evaluation (Signed)
  Anesthesia Post-op Note  Patient: Olivia [name]  Procedure(s) Performed: Procedure(s) with comments: LUMBAR LAMINECTOMY/DECOMPRESSION MICRODISCECTOMY 1 LEVEL (Right) - Right L5S1 far lateral microdiskectomy  Patient Location: PACU  Anesthesia Type:General  Level of Consciousness: awake  Airway and Oxygen Therapy: Patient Spontanous Breathing  Post-op Pain: mild  Post-op Assessment: Post-op Vital signs reviewed  Post-op Vital Signs: Reviewed  Last Vitals:  Filed Vitals:   06/28/14 1901  BP:   Pulse: 84  Temp:   Resp: 17    Complications: No apparent anesthesia complications

## 2014-06-28 NOTE — Anesthesia Preprocedure Evaluation (Signed)
Anesthesia Evaluation  Patient identified by MRN, date of birth, ID band Patient awake    Reviewed: Allergy & Precautions, NPO status , Patient's Chart, lab work & pertinent test results, reviewed documented beta blocker date and time   History of Anesthesia Complications Negative for: history of anesthetic complications  Airway Mallampati: II  TM Distance: >3 FB Neck ROM: Full    Dental  (+) Teeth Intact, Dental Advisory Given, Missing   Pulmonary Current Smoker,  breath sounds clear to auscultation        Cardiovascular hypertension, Pt. on medications and Pt. on home beta blockers - angina+ CAD, + Past MI ('12) and + Cardiac Stents ('12 DES Cx) Rhythm:Regular Rate:Normal  1/16 stress test: 1. No reversible ischemia or infarction. 2. Normal left ventricular wall motion. 3. Left ventricular ejection fraction 75% 4. Low-risk stress test findings*.   Neuro/Psych Chronic back pain    GI/Hepatic negative GI ROS, Neg liver ROS,   Endo/Other  diabetes (glu 196), Oral Hypoglycemic AgentsMorbid obesity  Renal/GU negative Renal ROS     Musculoskeletal   Abdominal (+) + obese,   Peds  Hematology   Anesthesia Other Findings   Reproductive/Obstetrics                             Anesthesia Physical Anesthesia Plan  ASA: III  Anesthesia Plan: General   Post-op Pain Management:    Induction: Intravenous  Airway Management Planned: Oral ETT  Additional Equipment:   Intra-op Plan:   Post-operative Plan: Extubation in OR  Informed Consent: I have reviewed the patients History and Physical, chart, labs and discussed the procedure including the risks, benefits and alternatives for the proposed anesthesia with the patient or authorized representative who has indicated his/her understanding and acceptance.   Dental advisory given  Plan Discussed with: CRNA and Surgeon  Anesthesia Plan Comments:  (Plan routine monitors, GETA)        Anesthesia Quick Evaluation

## 2014-06-28 NOTE — Plan of Care (Signed)
Problem: Consults Goal: Diagnosis - Spinal Surgery Outcome: Completed/Met Date Met:  06/28/14 Microdiscectomy     

## 2014-06-28 NOTE — H&P (Signed)
BP 141/81 mmHg  Pulse 87  Temp(Src) 98.1 F (36.7 C) (Oral)  Resp 20  Wt 89.359 kg (197 lb)  SpO2 99% Cc: right lower extremity pain Mrs. Holberg presents with right lower extremity pain. Mri shows what I believe to be a disc herniation at L5/S1 in the neural foramen. Conservative measures have not helped and she would like to proceed with a lumbar laminectomy and discetomy.  No Known Allergies Past Surgical History  Procedure Laterality Date  . Abdominal hysterectomy  2003    partial  . Cholecystectomy  1995  . Cardiac catheterization  2012    DES pCX 02/25/11   Past Medical History  Diagnosis Date  . Myocardial infarction 2012  . Hypertension   . Diabetes mellitus without complication   . Arthritis     back  . 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 Coronary artery disease    Prior to Admission medications   Medication Sig Start Date End Date Taking? Authorizing Provider  aspirin EC 81 MG tablet Take 81 mg by mouth daily.   Yes Historical Provider, MD  cholecalciferol (VITAMIN D) 1000 UNITS tablet Take 1,000 Units by mouth daily.   Yes Historical Provider, MD  glipiZIDE (GLUCOTROL) 10 MG tablet Take 10 mg by mouth daily before breakfast.   Yes Historical Provider, MD  levothyroxine (SYNTHROID, LEVOTHROID) 25 MCG tablet Take 25 mcg by mouth daily before breakfast.   Yes Historical Provider, MD  metFORMIN (GLUCOPHAGE) 1000 MG tablet Take 1,000 mg by mouth 2 (two) times daily with a meal.   Yes Historical Provider, MD  metoprolol tartrate (LOPRESSOR) 25 MG tablet Take 1 tablet (25 mg total) by mouth 2 (two) times daily. 04/10/13  Yes Donato Schultz, MD  pravastatin (PRAVACHOL) 40 MG tablet Take 40 mg by mouth daily.   Yes Historical Provider, MD  sitaGLIPtin (JANUVIA) 100 MG tablet Take 100 mg by mouth daily.   Yes Historical Provider, MD  vitamin E 400 UNIT capsule Take 400 Units by mouth daily.   Yes Historical  Provider, MD   Physical Exam  Constitutional: She is oriented to person, place, and time. She appears well-developed and well-nourished.  HENT:  Head: Normocephalic and atraumatic.  Right Ear: External ear normal.  Left Ear: External ear normal.  Eyes: EOM are normal. Pupils are equal, round, and reactive to light.  Neck: Normal range of motion. Neck supple.  Cardiovascular: Normal rate and regular rhythm.   Pulmonary/Chest: Effort normal and breath sounds normal.  Abdominal: Soft. Bowel sounds are normal.  Musculoskeletal: Normal range of motion.  Neurological: She is alert and oriented to person, place, and time. She has normal strength. No cranial nerve deficit or sensory deficit. She exhibits normal muscle tone. Coordination and gait normal. She displays no Babinski's sign on the right side. She displays no Babinski's sign on the left side.  Reflex Scores:      Tricep reflexes are 1+ on the right side and 1+ on the left side.      Bicep reflexes are 1+ on the right side and 1+ on the left side.      Brachioradialis reflexes are 1+ on the right side and 1+ on the left side.      Patellar reflexes are 1+ on the right side and 1+ on the left side.      Achilles reflexes are 1+ on the right side and 1+ on the  left side. Skin: Skin is warm and dry.  Review of Systems  HENT: Negative.   Eyes: Positive for blurred vision.  Cardiovascular: Negative.   Musculoskeletal: Positive for back pain.  Skin: Negative.   Neurological: Negative.   Endo/Heme/Allergies: Negative.   Psychiatric/Behavioral: Negative.   BP 141/81 mmHg  Pulse 87  Temp(Src) 98.1 F (36.7 C) (Oral)  Resp 20  Wt 89.359 kg (197 lb)  SpO2 99% Olivia [name] has decided to undergo a lumbar discetomy/decompression for a herniated disc at levels L5/S1 on the right side. Risks and benefits including but not limited to bleeding, infection, paralysis, weakness in one or both extremities, bowel and/or bladder dysfunction, need  for further surgery, no relief of pain. [place] understands and wishes to proceed.

## 2014-06-28 NOTE — Discharge Instructions (Addendum)
Wound Care Leave incision open to air. You may shower. Do not scrub directly on incision.  Do not put any creams, lotions, or ointments on incision. Activity Walk each and every day, increasing distance each day. No lifting greater than 5 lbs.  Avoid bending, arching, and twisting. No driving for 2 weeks; may ride as a passenger locally. If provided with back brace, wear when out of bed.  It is not necessary to wear in bed. Diet Resume your normal diet.  Return to Work Will be discussed at you follow up appointment. Call Your Doctor If Any of These Occur Redness, drainage, or swelling at the wound.  Temperature greater than 101 degrees. Severe pain not relieved by pain medication. Incision starts to come apart. Follow Up Appt Call today for appointment in 3-4 weeks ([phone]) or for problems.  If you have any hardware placed in your spine, you will need an x-ray before your appointment.             Lumbar Discectomy Care After A discectomy involves removal of discmaterial (the cartilage-like structures located between the bones of the back). It is done to relieve pressure on nerve roots. It can be used as a treatment for a back problem. The time in surgery depends on the findings in surgery and what is necessary to correct the problems. HOME CARE INSTRUCTIONS  Check the cut (incision) made by the surgeon twice a day for signs of infection. Some signs of infection may include:  A foul smelling, greenish or yellowish discharge from the wound.  Increased pain.  Increased redness over the incision (operative) site.  The skin edges may separate.  Flu-like symptoms (problems).  A temperature above 101.5 F (38.6 C).  Change your bandages in about 24 to 36 hours following surgery or as directed.  You may shower tomrrow.  Avoid bathtubs, swimming pools and hot tubs for three weeks or until your incision has healed completely. Follow your doctor's instructions as to safe  activities, exercises, and physical therapy.  Weight reduction may be beneficial if you are overweight.  Daily exercise is helpful to prevent the return of problems. Walking is permitted. You may use a treadmill without an incline. Cut down on activities and exercise if you have discomfort. You may also go up and down stairs as much as you can tolerate.  DO NOT lift anything heavier than 10 to 15 lbs. Avoid bending or twisting at the waist. Always bend your knees when lifting.  Maintain strength and range of motion as instructed.  Do not drive for 10 days, or as directed by your doctors. You may be a passenger . Lying back in the passenger seat may be more comfortable for you. Always wear a seatbelt.  Limit your sitting in a regular chair to 20 to 30 minutes at a time. There are no limitations for sitting in a recliner. You should lie down or walk in between sitting periods.  Only take over-the-counter or prescription medicines for pain, discomfort, or fever as directed by your caregiver.  SEEK MEDICAL CARE IF:  There is increased bleeding (more than a small spot) from the wound.  You notice redness, swelling, or increasing pain in the wound.  Pus is coming from wound.  You develop an unexplained oral temperature above 102 F (38.9 C) develops.  You notice a foul smell coming from the wound or dressing.  You have increasing pain in your wound.  SEEK IMMEDIATE MEDICAL CARE IF:  You develop   a rash.  You have difficulty breathing.  You develop any allergic problems to medicines given.  Document Released: 04/14/2004 Document Revised: 04/29/2011 Document Reviewed: 08/03/2007 Exit[employer] Patient Information  

## 2014-06-28 NOTE — Transfer of Care (Signed)
Immediate Anesthesia Transfer of Care Note  Patient: Olivia [name]  Procedure(s) Performed: Procedure(s) with comments: LUMBAR LAMINECTOMY/DECOMPRESSION MICRODISCECTOMY 1 LEVEL (Right) - Right L5S1 far lateral microdiskectomy  Patient Location: PACU  Anesthesia Type:General  Level of Consciousness: awake, alert  and oriented  Airway & Oxygen Therapy: Patient Spontanous Breathing and Patient connected to nasal cannula oxygen  Post-op Assessment: Report given to RN, Post -op Vital signs reviewed and stable and Patient moving all extremities X 4  Post vital signs: Reviewed and stable  Last Vitals:  Filed Vitals:   06/28/14 1756  BP:   Pulse: 83  Temp:   Resp: 24    Complications: No apparent anesthesia complications

## 2014-06-28 NOTE — Progress Notes (Signed)
Patient alert and oriented, mae's well, voiding adequate amount of urine, swallowing without difficulty, no c/o pain. Patient discharged home with family. Script and discharged instructions given to patient. Patient and family stated understanding of d/c instructions given and has an appointment with MD. Aisha Anthonette Lesage RN 

## 2014-06-28 NOTE — Discharge Summary (Signed)
Physician Discharge Summary  Patient ID: MALARI PASKINS MRN: [id number] DOB/AGE: [birth year] 54 y.o.  Admit date: 06/28/2014 Discharge date: 06/28/2014  Admission Diagnoses:HNP lumbar L5/S1 right far lateral  Discharge Diagnoses:  Active Problems:   HNP (herniated nucleus pulposus), lumbar   Discharged Condition: good  Hospital Course: Mrs. Olivia [name] was taken to the operating room for an uncomplicated lumbar discetomy at L5/S1 on the right side.Post op she is voiding, ambulating and tolerating a regular diet.   Consults: None  Significant Diagnostic Studies: none  Treatments: surgery: as above  Discharge Exam: Blood pressure 129/78, pulse 91, temperature 98.2 F (36.8 C), temperature source Oral, resp. rate 20, weight 89.359 kg (197 lb), SpO2 95 %. General appearance: alert, cooperative, appears stated age and no distress Neurologic: Alert and oriented X 3, normal strength and tone. Normal symmetric reflexes. Normal coordination and gait  Disposition: 01-Home or Self Care     Medication List    TAKE these medications        aspirin EC 81 MG tablet  Take 81 mg by mouth daily.     cholecalciferol 1000 UNITS tablet  Commonly known as:  VITAMIN D  Take 1,000 Units by mouth daily.     cyclobenzaprine 10 MG tablet  Commonly known as:  FLEXERIL  Take 1 tablet (10 mg total) by mouth 3 (three) times daily as needed for muscle spasms.     glipiZIDE 10 MG tablet  Commonly known as:  GLUCOTROL  Take 10 mg by mouth daily before breakfast.     levothyroxine 25 MCG tablet  Commonly known as:  SYNTHROID, LEVOTHROID  Take 25 mcg by mouth daily before breakfast.     metFORMIN 1000 MG tablet  Commonly known as:  GLUCOPHAGE  Take 1,000 mg by mouth 2 (two) times daily with a meal.     metoprolol tartrate 25 MG tablet  Commonly known as:  LOPRESSOR  Take 1 tablet (25 mg total) by mouth 2 (two) times daily.     pravastatin 40 MG tablet  Commonly known as:  PRAVACHOL  Take 40 mg  by mouth daily.     sitaGLIPtin 100 MG tablet  Commonly known as:  JANUVIA  Take 100 mg by mouth daily.     traMADol 50 MG tablet  Commonly known as:  ULTRAM  Take 1 tablet (50 mg total) by mouth every 6 (six) hours as needed.     vitamin E 400 UNIT capsule  Take 400 Units by mouth daily.           Follow-up Information    Follow up with Melbourne Jakubiak L, MD In 3 weeks.   Specialty:  Neurosurgery   Contact information:   [address] [place] [address] [phone]       Signed: Carmela Hurt 06/28/2014, 9:39 PM

## 2014-06-28 NOTE — Anesthesia Procedure Notes (Signed)
Procedure Name: Intubation Date/Time: 06/28/2014 3:27 PM Performed by: Wray Kearns A Pre-anesthesia Checklist: Patient identified, Timeout performed, Emergency Drugs available, Suction available and Patient being monitored Patient Re-evaluated:Patient Re-evaluated prior to inductionOxygen Delivery Method: Circle system utilized Preoxygenation: Pre-oxygenation with 100% oxygen Intubation Type: IV induction and Cricoid Pressure applied Ventilation: Mask ventilation without difficulty Laryngoscope Size: Mac and 4 Grade View: Grade I Tube type: Oral Tube size: 7.5 mm Number of attempts: 1 Airway Equipment and Method: Stylet Placement Confirmation: ETT inserted through vocal cords under direct vision,  breath sounds checked- equal and bilateral and positive ETCO2 Secured at: 22 cm Tube secured with: Tape Dental Injury: Teeth and Oropharynx as per pre-operative assessment

## 2014-07-01 ENCOUNTER — Encounter (HOSPITAL_COMMUNITY): Payer: Self-pay | Admitting: Neurosurgery

## 2014-09-20 ENCOUNTER — Other Ambulatory Visit (HOSPITAL_COMMUNITY): Payer: Self-pay | Admitting: Neurosurgery

## 2014-09-20 DIAGNOSIS — M4726 Other spondylosis with radiculopathy, lumbar region: Secondary | ICD-10-CM

## 2014-09-25 ENCOUNTER — Ambulatory Visit (HOSPITAL_COMMUNITY)
Admission: RE | Admit: 2014-09-25 | Discharge: 2014-09-25 | Disposition: A | Discharge status: Home / Self Care | Payer: BLUE CROSS/BLUE SHIELD | Source: Ambulatory Visit | Attending: Neurosurgery | Admitting: Neurosurgery

## 2014-09-25 DIAGNOSIS — M4726 Other spondylosis with radiculopathy, lumbar region: Secondary | ICD-10-CM

## 2014-09-25 DIAGNOSIS — Z9889 Other specified postprocedural states: Secondary | ICD-10-CM | POA: Diagnosis not present

## 2014-09-25 DIAGNOSIS — M545 Low back pain: Secondary | ICD-10-CM | POA: Diagnosis not present

## 2014-09-25 LAB — POCT I-STAT CREATININE: CREATININE: 0.6 mg/dL (ref 0.44–1.00)

## 2014-09-25 MED ORDER — GADOBENATE DIMEGLUMINE 529 MG/ML IV SOLN
18.0000 mL | Freq: Once | INTRAVENOUS | Status: AC | PRN
  Administered 2014-09-25: 18 mL via INTRAVENOUS

## 2014-11-11 ENCOUNTER — Encounter: Payer: Self-pay | Admitting: Obstetrics and Gynecology

## 2014-11-11 ENCOUNTER — Ambulatory Visit (INDEPENDENT_AMBULATORY_CARE_PROVIDER_SITE_OTHER): Payer: BLUE CROSS/BLUE SHIELD | Admitting: Obstetrics and Gynecology

## 2014-11-11 VITALS — BP 118/82 | Ht 63.0 in | Wt 186.5 lb

## 2014-11-11 DIAGNOSIS — Z01419 Encounter for gynecological examination (general) (routine) without abnormal findings: Secondary | ICD-10-CM

## 2014-11-11 NOTE — Progress Notes (Signed)
Patient ID: Olivia [name], female   DOB: 09/01/1960, 54 y.o.   MRN: [id number]  Assessment:  Annual Gyn Exam s/p hyst Pt not a candidate for Hormone Therapy due to history of blood clot with opthalmoplegia and recovery Plan:  1. pap smear not done, cervix surgically removed 2. [id number] or prn 3    Annual mammogram advised Subjective:  Olivia [name] is a 54 y.o. female No obstetric history on file. who presents for annual exam. No LMP recorded. Patient has had a hysterectomy.partial The patient has complaints today of constant pain in her lower back which is increased by walking and gets worse as the day goes on. She also has pain on her right sided back radiating down her buttock into her right lower extremity. Patient had a lumbar laminectomy done in February for a herniated disc but continues to have chronic back pain. She had also tried trigger point injections and steroid injections with no relief. Patient had a spinal tap done but has not yet received the results for this. 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   Patient also complains of thinning hair. She is not sexually active at this time.    She reports a history of HTN, DM, and MI. Patient has a history of clot behind her right eye. She takes a daily aspirin. Patient denies any other blood thinner use.   Dr. Margo Aye monitors her blood tests.  Patient had a partial hysterectomy done by [facility] in 2003.   Patient denies ever having had a mammogram done.    The following portions of the patient's history were reviewed and updated as appropriate: allergies, current medications, past family history, past medical history, past social history, past surgical history and problem list. Past Medical History  Diagnosis Date  . Myocardial infarction 2012  . Hypertension   . Diabetes mellitus without complication   . Arthritis     back  . 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 Coronary artery disease     Past Surgical History  Procedure Laterality Date  . Abdominal hysterectomy  2003    partial  . Cholecystectomy  1995  . Cardiac catheterization  2012    DES pCX 02/25/11  . Lumbar laminectomy/decompression microdiscectomy Right 06/28/2014    Procedure: LUMBAR LAMINECTOMY/DECOMPRESSION MICRODISCECTOMY 1 LEVEL;  Surgeon: Coletta Memos, MD;  Location: [facility];  Service: Neurosurgery;  Laterality: Right;  Right L5S1 far lateral microdiskectomy     Current outpatient prescriptions:  .  aspirin EC 81 MG tablet, Take 81 mg by mouth daily., Disp: , Rfl:  .  cholecalciferol (VITAMIN D) 1000 UNITS tablet, Take 1,000 Units by mouth daily., Disp: , Rfl:  .  cyclobenzaprine (FLEXERIL) 10 MG tablet, Take 1 tablet (10 mg total) by mouth 3 (three) times daily as needed for muscle spasms., Disp: 60 tablet, Rfl: 0 .  gabapentin (NEURONTIN) 300 MG capsule, , Disp: , Rfl:  .  glipiZIDE (GLUCOTROL) 10 MG tablet, Take 10 mg by mouth daily before breakfast., Disp: , Rfl:  .  levothyroxine (SYNTHROID, LEVOTHROID) 25 MCG tablet, Take 25 mcg by mouth daily before breakfast., Disp: , Rfl:  .  lisinopril (PRINIVIL,ZESTRIL) 5 MG tablet, , Disp: , Rfl:  .  metFORMIN (GLUCOPHAGE) 1000 MG tablet, Take 1,000 mg by mouth 2 (two) times daily with a meal., Disp: , Rfl:  .  metoprolol tartrate (LOPRESSOR) 25 MG tablet, Take 1 tablet (25 mg total) by mouth 2 (two) times daily., Disp: 60 tablet, Rfl: 5 .  pravastatin (PRAVACHOL) 40 MG tablet, Take 40 mg by mouth daily., Disp: , Rfl:  .  sitaGLIPtin (JANUVIA) 100 MG tablet, Take 100 mg by mouth daily., Disp: , Rfl:  .  traMADol (ULTRAM) 50 MG tablet, Take 1 tablet (50 mg total) by mouth every 6 (six) hours as needed., Disp: 60 tablet, Rfl: 0 .  vitamin E 400 UNIT capsule, Take 400 Units by mouth daily., Disp: , Rfl:   Review of Systems Constitutional:  negative Gastrointestinal: negative Genitourinary: negative A complete review of systems was obtained and all systems are negative except as noted in the HPI and PMH.    Objective:  BP 118/82 mmHg  Ht  (1.6 m)  Wt 186 lb 8 oz (84.596 kg)  BMI 33.05 kg/m2   BMI: Body mass index is 33.05 kg/(m^2).  General Appearance: Alert, appropriate appearance for age. No acute distress HEENT: Grossly normal Neck / Thyroid:  Cardiovascular: RRR; normal S1, S2, no murmur Lungs: CTA bilaterally Back: No CVAT Breast Exam: No dimpling, nipple retraction or discharge. No masses or nodes., Normal to inspection, Normal breast tissue bilaterally and No masses or nodes.No dimpling, nipple retraction or discharge. Gastrointestinal: Soft, non-tender, no masses or organomegaly Pelvic Exam: External genitalia: normal general appearance Urinary system: urethral meatus normal Vaginal: normal mucosa without prolapse or lesions Cervix: removed surgically Adnexa: normal bimanual exam Uterus: removed surgically Rectal: good sphincter tone  Rectovaginal: not indicated Lymphatic Exam: Non-palpable nodes in neck, clavicular, axillary, or inguinal regions Skin: no rash or abnormalities Neurologic: Normal gait and speech, no tremor  Psychiatric: Alert and oriented, appropriate affect.  Urinalysis:Not done  Christin Bach. MD Pgr [phone] 11:00 AM   This chart was SCRIBED for Christin Bach, MD by Ronney Lion, ED Scribe. This patient was seen in room 1 and the patient's care was started at 11:12 AM.  I personally performed the services described in this documentation, which was SCRIBED in my presence. The recorded information has been reviewed and considered accurate. It has been edited as necessary during review. Tilda Burrow, MD

## 2014-11-11 NOTE — Progress Notes (Signed)
Patient ID: [name], female   DOB: February 27, 1961, 54 y.o.   MRN: [id number] Pt here today for annual exam. Pt states that she is having pain in her lower back and the pain gets worse as the day goes on. Pt wants to discuss menopause symptoms.

## 2015-02-24 ENCOUNTER — Encounter: Payer: BLUE CROSS/BLUE SHIELD | Admitting: Adult Health

## 2015-02-24 NOTE — Progress Notes (Signed)
Cardiology Office Note   Date:  02/24/2015   ERROR, No show

## 2015-03-07 ENCOUNTER — Encounter: Payer: Self-pay | Admitting: *Deleted

## 2015-03-07 ENCOUNTER — Ambulatory Visit (INDEPENDENT_AMBULATORY_CARE_PROVIDER_SITE_OTHER): Payer: BLUE CROSS/BLUE SHIELD | Admitting: Cardiovascular Disease

## 2015-03-07 ENCOUNTER — Encounter: Payer: Self-pay | Admitting: Cardiovascular Disease

## 2015-03-07 VITALS — BP 97/65 | HR 88 | Ht 63.0 in | Wt 185.0 lb

## 2015-03-07 DIAGNOSIS — E785 Hyperlipidemia, unspecified: Secondary | ICD-10-CM

## 2015-03-07 DIAGNOSIS — R079 Chest pain, unspecified: Secondary | ICD-10-CM | POA: Diagnosis not present

## 2015-03-07 DIAGNOSIS — I252 Old myocardial infarction: Secondary | ICD-10-CM

## 2015-03-07 DIAGNOSIS — I25118 Atherosclerotic heart disease of native coronary artery with other forms of angina pectoris: Secondary | ICD-10-CM | POA: Diagnosis not present

## 2015-03-07 DIAGNOSIS — Z955 Presence of coronary angioplasty implant and graft: Secondary | ICD-10-CM

## 2015-03-07 DIAGNOSIS — I2510 Atherosclerotic heart disease of native coronary artery without angina pectoris: Secondary | ICD-10-CM | POA: Diagnosis not present

## 2015-03-07 DIAGNOSIS — Z720 Tobacco use: Secondary | ICD-10-CM

## 2015-03-07 DIAGNOSIS — I10 Essential (primary) hypertension: Secondary | ICD-10-CM

## 2015-03-07 MED ORDER — NITROGLYCERIN 0.4 MG SL SUBL: 0.4000 mg | SUBLINGUAL_TABLET | SUBLINGUAL | Status: AC | PRN

## 2015-03-07 MED ORDER — METOPROLOL TARTRATE 50 MG PO TABS: 50.0000 mg | ORAL_TABLET | Freq: Two times a day (BID) | ORAL | Status: DC

## 2015-03-07 NOTE — Patient Instructions (Signed)
Your physician recommends that you schedule a follow-up appointment in: 2-3 WEEKS WITH DR. Purvis Sheffield  Your physician has recommended you make the following change in your medication:   INCREASE METOPROLOL 50 MG TWICE DAILY  STOP TAKING LISINOPRIL  WE HAVE REFILLED NITROGLYCERIN   Thank you for choosing [facility]!!

## 2015-03-07 NOTE — Progress Notes (Signed)
Patient ID: Olivia [name], female   DOB: September 15, 1960, 54 y.o.   MRN: [id number]      SUBJECTIVE: The patient presents for follow-up of coronary artery disease. She has a history of coronary artery disease and a non-STEMI and underwent drug-eluting stent placement to the proximal left circumflex coronary artery on 02/25/2011. She also has a medical history significant for type 2 diabetes mellitus, hypertension, hyperlipidemia, peripheral vascular disease with an occluded left internal carotid artery, and ongoing tobacco abuse.  Coronary angiography on 02/25/2011 demonstrated 60% narrowing of the LAD beyond the second septal branch. She had a 99% proximal left circumflex coronary artery stenosis which was deemed the culprit vessel at the time of her MI. The right coronary artery had a proximal 40-60% stenosis. Left ventriculography demonstrated normal ejection fraction and wall motion with no significant mitral regurgitation and elevated end-diastolic pressure of 23 mmHg.  She underwent nuclear stress testing on 06/21/14 for preoperative risk stratification purposes and was found to be normal with no evidence of ischemia or infarction, LVEF 75%.  The other day while at work she began to experience chest tightness. She wondered if it was due to stress at work. Prior to this she denies any exertional chest pain and shortness of breath. She has mild leg swelling from time to time. She presently denies chest pain. ECG performed in the office today demonstrated normal sinus rhythm with no ischemic ST segment T-wave abnormalities.   Review of Systems: As per "subjective", otherwise negative.  No Known Allergies  Current Outpatient Prescriptions  Medication Sig Dispense Refill  . aspirin EC 81 MG tablet Take 81 mg by mouth daily.    . cholecalciferol (VITAMIN D) 1000 UNITS tablet Take 1,000 Units by mouth daily.    . cyclobenzaprine (FLEXERIL) 10 MG tablet Take 1 tablet (10 mg total) by mouth 3 (three)  times daily as needed for muscle spasms. 60 tablet 0  . gabapentin (NEURONTIN) 300 MG capsule     . glipiZIDE (GLUCOTROL) 10 MG tablet Take 10 mg by mouth daily before breakfast.    . levothyroxine (SYNTHROID, LEVOTHROID) 25 MCG tablet Take 25 mcg by mouth daily before breakfast.    . metFORMIN (GLUCOPHAGE) 1000 MG tablet Take 1,000 mg by mouth 2 (two) times daily with a meal.    . pravastatin (PRAVACHOL) 40 MG tablet Take 40 mg by mouth daily.    . sitaGLIPtin (JANUVIA) 100 MG tablet Take 100 mg by mouth daily.    . traMADol (ULTRAM) 50 MG tablet Take 1 tablet (50 mg total) by mouth every 6 (six) hours as needed. 60 tablet 0  . vitamin E 400 UNIT capsule Take 400 Units by mouth daily.    . metoprolol (LOPRESSOR) 50 MG tablet Take 1 tablet (50 mg total) by mouth 2 (two) times daily. 180 tablet 3  . nitroGLYCERIN (NITROSTAT) 0.4 MG SL tablet Place 1 tablet (0.4 mg total) under the tongue every 5 (five) minutes as needed for chest pain. 25 tablet 3   No current facility-administered medications for this visit.    Past Medical History  Diagnosis Date  . Myocardial infarction (HCC) 2012  . Hypertension   . Diabetes mellitus without complication (HCC)   . Arthritis     back  . 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 Coronary artery disease     Past Surgical History  Procedure Laterality Date  . Abdominal hysterectomy  2003    partial  . Cholecystectomy  1995  . Cardiac catheterization  2012    DES pCX 02/25/11  . Lumbar laminectomy/decompression microdiscectomy Right 06/28/2014    Procedure: LUMBAR LAMINECTOMY/DECOMPRESSION MICRODISCECTOMY 1 LEVEL;  Surgeon: Coletta Memos, MD;  Location: [facility];  Service: Neurosurgery;  Laterality: Right;  Right L5S1 far lateral microdiskectomy    Social History   Social History  . Marital Status: Married    Spouse Name: N/A  . Number of Children: N/A  . Years of Education:  N/A   Occupational History  . Not on file.   Social History Main Topics  . Smoking status: Current Every Day Smoker -- 1.00 packs/day for 30 years    Types: Cigarettes    Start date: 01/03/1976  . Smokeless tobacco: Never Used  . Alcohol Use: No  . Drug Use: No  . Sexual Activity: Not Currently    Birth Control/ Protection: Surgical   Other Topics Concern  . Not on file   Social History Narrative     Filed Vitals:   03/07/15 1034  BP: 97/65  Pulse: 88  Height:  (1.6 m)  Weight: 185 lb (83.915 kg)  SpO2: 98%    PHYSICAL EXAM General: NAD, obese Neck: No JVD, no thyromegaly or thyroid nodule.  Lungs: Clear to auscultation bilaterally with normal respiratory effort. CV: Nondisplaced PMI. Regular rate and rhythm, normal S1/S2, no S3/S4, soft 1/6 systolic murmur over RUSB and LUSB. Trivial peripheral edema. No carotid bruit.  Abdomen: Soft, obese, no distention.  Skin: Intact without lesions or rashes.  Neurologic: Alert and oriented x 3.  Psych: Normal affect. Extremities: No clubbing or cyanosis.  HEENT: Normal.   ECG: Most recent ECG reviewed.      ASSESSMENT AND PLAN: 1. Chest pain in context of CAD with h/o left circumflex coronary artery stent: She had known residual blockages as noted above at time of last coronary angiogram. Will stop lisinopril to allow for increase in metoprolol to 50 mg bid to reduce myocardial demand. Continue ASA and pravastatin. Will reassess symptoms in 2-3 weeks. Will refill SL nitroglycerin as her bottle is outdated.  2. Essential HTN: Low normal. Will stop lisinopril to allow for increase in metoprolol to 50 mg bid to reduce myocardial demand.  3. Hyperlipidemia: On pravastatin 40 mg. Will obtain copy of lipids from PCP's office.  4. Tobacco abuse: Cessation counseling previously provided.  Dispo: f/u 2-3 weeks.  Prentice Docker, M.D., F.A.C.C.

## 2015-03-31 ENCOUNTER — Ambulatory Visit (INDEPENDENT_AMBULATORY_CARE_PROVIDER_SITE_OTHER): Payer: BLUE CROSS/BLUE SHIELD | Admitting: Cardiovascular Disease

## 2015-03-31 ENCOUNTER — Encounter: Payer: Self-pay | Admitting: Cardiovascular Disease

## 2015-03-31 VITALS — BP 122/78 | HR 92 | Ht 63.0 in | Wt 188.0 lb

## 2015-03-31 DIAGNOSIS — Z955 Presence of coronary angioplasty implant and graft: Secondary | ICD-10-CM | POA: Diagnosis not present

## 2015-03-31 DIAGNOSIS — R079 Chest pain, unspecified: Secondary | ICD-10-CM | POA: Diagnosis not present

## 2015-03-31 DIAGNOSIS — I252 Old myocardial infarction: Secondary | ICD-10-CM

## 2015-03-31 DIAGNOSIS — Z720 Tobacco use: Secondary | ICD-10-CM

## 2015-03-31 DIAGNOSIS — E785 Hyperlipidemia, unspecified: Secondary | ICD-10-CM

## 2015-03-31 DIAGNOSIS — I25118 Atherosclerotic heart disease of native coronary artery with other forms of angina pectoris: Secondary | ICD-10-CM

## 2015-03-31 DIAGNOSIS — I10 Essential (primary) hypertension: Secondary | ICD-10-CM

## 2015-03-31 MED ORDER — LISINOPRIL 5 MG PO TABS: 5.0000 mg | ORAL_TABLET | Freq: Every day | ORAL | Status: AC

## 2015-03-31 MED ORDER — RANOLAZINE ER 500 MG PO TB12: 500.0000 mg | ORAL_TABLET | Freq: Two times a day (BID) | ORAL | Status: DC

## 2015-03-31 NOTE — Patient Instructions (Addendum)
   Resume the Lisinopril 5mg  daily - please call office to confirm dose.  Increase your Metoprolol back up to twice a day   Begin Ranexa 500mg  twice a day  - new sent to [facility] today.  Continue all other medications.   Follow up in  1 month

## 2015-03-31 NOTE — Progress Notes (Signed)
Patient ID: SASHA ROGEL, female   DOB: 29-Sep-1960, 54 y.o.   MRN: [id number]      SUBJECTIVE: The patient presents for follow-up of coronary artery disease. She has a history of coronary artery disease and a non-STEMI and underwent drug-eluting stent placement to the proximal left circumflex coronary artery on 02/25/2011. She also has a medical history significant for type 2 diabetes mellitus, hypertension, hyperlipidemia, peripheral vascular disease with an occluded left internal carotid artery, and ongoing tobacco abuse.  Coronary angiography on 02/25/2011 demonstrated 60% narrowing of the LAD beyond the second septal branch. She had a 99% proximal left circumflex coronary artery stenosis which was deemed the culprit vessel at the time of her MI. The right coronary artery had a proximal 40-60% stenosis. Left ventriculography demonstrated normal ejection fraction and wall motion with no significant mitral regurgitation and elevated end-diastolic pressure of 23 mmHg.  She underwent nuclear stress testing on 06/21/14 for preoperative risk stratification purposes and was found to be normal with no evidence of ischemia or infarction, LVEF 75%.  Due to chest pain, I increased metoprolol at her last visit and stopped lisinopril. However, she said her blood pressure went up to 243/101 and subsequently to 240/97. She decreased her metoprolol back to 25 mg twice daily and started lisinopril 5 mg daily. She says chest pain occurs when she takes deep breaths but also when she climbs stairs and walks at work. Last night she had some blurry vision.   Review of Systems: As per "subjective", otherwise negative.  No Known Allergies  Current Outpatient Prescriptions  Medication Sig Dispense Refill  . aspirin EC 81 MG tablet Take 81 mg by mouth daily.    . cholecalciferol (VITAMIN D) 1000 UNITS tablet Take 1,000 Units by mouth daily.    . cyclobenzaprine (FLEXERIL) 10 MG tablet Take 1 tablet (10 mg total) by  mouth 3 (three) times daily as needed for muscle spasms. 60 tablet 0  . gabapentin (NEURONTIN) 300 MG capsule     . glipiZIDE (GLUCOTROL) 10 MG tablet Take 10 mg by mouth daily before breakfast.    . levothyroxine (SYNTHROID, LEVOTHROID) 25 MCG tablet Take 25 mcg by mouth daily before breakfast.    . metFORMIN (GLUCOPHAGE) 1000 MG tablet Take 1,000 mg by mouth 2 (two) times daily with a meal.    . metoprolol (LOPRESSOR) 50 MG tablet Take 1 tablet (50 mg total) by mouth 2 (two) times daily. 180 tablet 3  . nitroGLYCERIN (NITROSTAT) 0.4 MG SL tablet Place 1 tablet (0.4 mg total) under the tongue every 5 (five) minutes as needed for chest pain. 25 tablet 3  . pravastatin (PRAVACHOL) 40 MG tablet Take 40 mg by mouth daily.    . sitaGLIPtin (JANUVIA) 100 MG tablet Take 100 mg by mouth daily.    . traMADol (ULTRAM) 50 MG tablet Take 1 tablet (50 mg total) by mouth every 6 (six) hours as needed. 60 tablet 0  . vitamin E 400 UNIT capsule Take 400 Units by mouth daily.     No current facility-administered medications for this visit.    Past Medical History  Diagnosis Date  . Myocardial infarction (HCC) 2012  . Hypertension   . Diabetes mellitus without complication (HCC)   . Arthritis     back  . 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 Coronary artery disease     Past Surgical History  Procedure Laterality  Date  . Abdominal hysterectomy  2003    partial  . Cholecystectomy  1995  . Cardiac catheterization  2012    DES pCX 02/25/11  . Lumbar laminectomy/decompression microdiscectomy Right 06/28/2014    Procedure: LUMBAR LAMINECTOMY/DECOMPRESSION MICRODISCECTOMY 1 LEVEL;  Surgeon: Coletta Memos, MD;  Location: [facility];  Service: Neurosurgery;  Laterality: Right;  Right L5S1 far lateral microdiskectomy    Social History   Social History  . Marital Status: Married    Spouse Name: N/A  . Number of Children: N/A  . Years  of Education: N/A   Occupational History  . Not on file.   Social History Main Topics  . Smoking status: Current Every Day Smoker -- 1.00 packs/day for 30 years    Types: Cigarettes    Start date: 01/03/1976  . Smokeless tobacco: Never Used  . Alcohol Use: No  . Drug Use: No  . Sexual Activity: Not Currently    Birth Control/ Protection: Surgical   Other Topics Concern  . Not on file   Social History Narrative     Filed Vitals:   03/31/15 1013  BP: 122/78  Pulse: 92  Height: 5\' 3"  (1.6 m)  Weight: 188 lb (85.276 kg)  SpO2: 98%    PHYSICAL EXAM General: NAD, obese Neck: No JVD, no thyromegaly or thyroid nodule.  Lungs: Clear to auscultation bilaterally with normal respiratory effort. CV: Nondisplaced PMI. Regular rate and rhythm, normal S1/S2, no S3/S4, soft 1/6 systolic murmur over RUSB and LUSB. Trivial peripheral edema..  Abdomen: Soft, obese, no distention.  Skin: Intact without lesions or rashes.  Neurologic: Alert and oriented x 3.  Psych: Normal affect. Extremities: No clubbing or cyanosis.  HEENT: Normal.   ECG: Most recent ECG reviewed.      ASSESSMENT AND PLAN: 1. Chest pain in context of CAD with h/o left circumflex coronary artery stent: She had known residual blockages as noted above at time of last coronary angiogram. I previously stopped lisinopril to allow for an increase in metoprolol to 50 mg bid to reduce myocardial demand. However, due to elevated BP she resumed lisinopril 5 mg and reduced metoprolol back to 25 mg bid. I will increase metoprolol back to 50 mg bid, continue lisinopril, and start Ranexa 500 mg bid. Continue ASA and pravastatin.   2. Essential HTN: Controlled. No changes.  3. Hyperlipidemia: On pravastatin 40 mg. Will obtain copy of lipids from PCP's office.  4. Tobacco abuse: Cessation counseling previously provided.  Dispo: f/u 4 weeks.   Prentice Docker, M.D., F.A.C.C.

## 2015-05-12 ENCOUNTER — Ambulatory Visit: Payer: BLUE CROSS/BLUE SHIELD | Admitting: Cardiovascular Disease

## 2015-06-10 ENCOUNTER — Encounter: Payer: Self-pay | Admitting: Cardiovascular Disease

## 2015-06-10 ENCOUNTER — Ambulatory Visit (INDEPENDENT_AMBULATORY_CARE_PROVIDER_SITE_OTHER): Payer: BLUE CROSS/BLUE SHIELD | Admitting: Cardiovascular Disease

## 2015-06-10 VITALS — BP 142/72 | HR 97 | Ht 63.0 in | Wt 186.0 lb

## 2015-06-10 DIAGNOSIS — E785 Hyperlipidemia, unspecified: Secondary | ICD-10-CM

## 2015-06-10 DIAGNOSIS — R079 Chest pain, unspecified: Secondary | ICD-10-CM | POA: Diagnosis not present

## 2015-06-10 DIAGNOSIS — I25118 Atherosclerotic heart disease of native coronary artery with other forms of angina pectoris: Secondary | ICD-10-CM | POA: Diagnosis not present

## 2015-06-10 DIAGNOSIS — Z955 Presence of coronary angioplasty implant and graft: Secondary | ICD-10-CM | POA: Diagnosis not present

## 2015-06-10 DIAGNOSIS — Z720 Tobacco use: Secondary | ICD-10-CM

## 2015-06-10 DIAGNOSIS — I252 Old myocardial infarction: Secondary | ICD-10-CM | POA: Diagnosis not present

## 2015-06-10 DIAGNOSIS — I10 Essential (primary) hypertension: Secondary | ICD-10-CM

## 2015-06-10 NOTE — Progress Notes (Signed)
Patient ID: Olivia [name], female   DOB: September 01, 1960, 55 y.o.   MRN: [id number]      SUBJECTIVE: The patient presents for follow-up of coronary artery disease. She has a history of coronary artery disease and a non-STEMI and underwent drug-eluting stent placement to the proximal left circumflex coronary artery on 02/25/2011. She also has a medical history significant for type 2 diabetes mellitus, hypertension, hyperlipidemia, peripheral vascular disease with an occluded left internal carotid artery, and ongoing tobacco abuse.  Coronary angiography on 02/25/2011 demonstrated 60% narrowing of the LAD beyond the second septal branch. She had a 99% proximal left circumflex coronary artery stenosis which was deemed the culprit vessel at the time of her MI. The right coronary artery had a proximal 40-60% stenosis. Left ventriculography demonstrated normal ejection fraction and wall motion with no significant mitral regurgitation and elevated end-diastolic pressure of 23 mmHg.  She underwent nuclear stress testing on 06/21/14 for preoperative risk stratification purposes and was found to be normal with no evidence of ischemia or infarction, LVEF 75%.   I started Ranexa at her last visit and she no longer has any chest pain.   Review of Systems: As per "subjective", otherwise negative.  No Known Allergies  Current Outpatient Prescriptions  Medication Sig Dispense Refill  . aspirin EC 81 MG tablet Take 81 mg by mouth daily.    . cholecalciferol (VITAMIN D) 1000 UNITS tablet Take 1,000 Units by mouth daily.    . cyclobenzaprine (FLEXERIL) 10 MG tablet Take 1 tablet (10 mg total) by mouth 3 (three) times daily as needed for muscle spasms. 60 tablet 0  . gabapentin (NEURONTIN) 300 MG capsule     . glipiZIDE (GLUCOTROL) 10 MG tablet Take 10 mg by mouth daily before breakfast.    . levothyroxine (SYNTHROID, LEVOTHROID) 25 MCG tablet Take 25 mcg by mouth daily before breakfast.    . lisinopril  (PRINIVIL,ZESTRIL) 5 MG tablet Take 1 tablet (5 mg total) by mouth daily.    . metFORMIN (GLUCOPHAGE) 1000 MG tablet Take 1,000 mg by mouth 2 (two) times daily with a meal.    . metoprolol (LOPRESSOR) 50 MG tablet Take 1 tablet (50 mg total) by mouth 2 (two) times daily. 180 tablet 3  . nitroGLYCERIN (NITROSTAT) 0.4 MG SL tablet Place 1 tablet (0.4 mg total) under the tongue every 5 (five) minutes as needed for chest pain. 25 tablet 3  . pravastatin (PRAVACHOL) 40 MG tablet Take 40 mg by mouth daily.    . ranolazine (RANEXA) 500 MG 12 hr tablet Take 1 tablet (500 mg total) by mouth 2 (two) times daily. 60 tablet 6  . sitaGLIPtin (JANUVIA) 100 MG tablet Take 100 mg by mouth daily.    . traMADol (ULTRAM) 50 MG tablet Take 1 tablet (50 mg total) by mouth every 6 (six) hours as needed. 60 tablet 0  . vitamin E 400 UNIT capsule Take 400 Units by mouth daily.     No current facility-administered medications for this visit.    Past Medical History  Diagnosis Date  . Myocardial infarction (HCC) 2012  . Hypertension   . Diabetes mellitus without complication (HCC)   . Arthritis     back  . 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 Coronary artery disease     Past Surgical History  Procedure Laterality Date  . Abdominal hysterectomy  2003    partial  .  Cholecystectomy  1995  . Cardiac catheterization  2012    DES pCX 02/25/11  . Lumbar laminectomy/decompression microdiscectomy Right 06/28/2014    Procedure: LUMBAR LAMINECTOMY/DECOMPRESSION MICRODISCECTOMY 1 LEVEL;  Surgeon: Coletta Memos, MD;  Location: [facility];  Service: Neurosurgery;  Laterality: Right;  Right L5S1 far lateral microdiskectomy    Social History   Social History  . Marital Status: Married    Spouse Name: N/A  . Number of Children: N/A  . Years of Education: N/A   Occupational History  . Not on file.   Social History Main Topics  . Smoking status:  Current Every Day Smoker -- 1.00 packs/day for 30 years    Types: Cigarettes    Start date: 01/03/1976  . Smokeless tobacco: Never Used  . Alcohol Use: No  . Drug Use: No  . Sexual Activity: Not Currently    Birth Control/ Protection: Surgical   Other Topics Concern  . Not on file   Social History Narrative     Filed Vitals:   06/10/15 1459  BP: 142/72  Pulse: 97  Height: 5\' 3"  (1.6 m)  Weight: 186 lb (84.369 kg)  SpO2: 96%    PHYSICAL EXAM General: NAD, obese Neck: No JVD, no thyromegaly or thyroid nodule.  Lungs: Clear to auscultation bilaterally with normal respiratory effort. CV: Nondisplaced PMI. Regular rate and rhythm, normal S1/S2, no S3/S4, soft 1/6 systolic murmur over RUSB and LUSB. Trivial peripheral edema.  Abdomen: Soft, obese, no distention.  Skin: Intact without lesions or rashes.  Neurologic: Alert and oriented x 3.  Psych: Normal affect. Extremities: No clubbing or cyanosis.  HEENT: Normal.    ECG: Most recent ECG reviewed.      ASSESSMENT AND PLAN: 1. Chest pain in context of CAD with h/o left circumflex coronary artery stent: Symptomatically stable after addition of Ranexa 500 mg bid. She had known residual blockages as noted above at time of last coronary angiogram. Continue ASA, metoprolol, and pravastatin.   2. Essential HTN: Mildly elevated. No changes.  3. Hyperlipidemia: On pravastatin 40 mg. LDL 62, TG 287 in 06/2014. No changes.  4. Tobacco abuse: Cessation counseling previously provided.  Dispo: f/u 6 months.  Prentice Docker, M.D., F.A.C.C.

## 2015-06-10 NOTE — Patient Instructions (Signed)
Continue all current medications. Your physician wants you to follow up in: 6 months.  You will receive a reminder letter in the mail one-two months in advance.  If you don't receive a letter, please call our office to schedule the follow up appointment   

## 2015-06-13 ENCOUNTER — Telehealth: Payer: Self-pay | Admitting: Cardiovascular Disease

## 2015-06-13 NOTE — Telephone Encounter (Signed)
Ideally would stay on ASA 81 mg indefinitely. If the procedure needs to be performed off ASA, can do so but would resume asap given residual coronary blockages.

## 2015-06-13 NOTE — Telephone Encounter (Signed)
Olivia [name] called stating that she needs to have an upcoming laser procedure.  Was told that she could not go off her Aspirin for 5 days.  Please advise

## 2015-06-13 NOTE — Telephone Encounter (Signed)
Patient informed and verbalized understanding. Patient informed to have the office doing the laser procedure contact us directly.

## 2015-06-13 NOTE — Telephone Encounter (Signed)
Please advise if its okay for this patient to hold aspirin 5 days prior to laser procedure.

## 2015-06-18 NOTE — Telephone Encounter (Addendum)
This note faxed to [facility] today.  (Attention Morrie Sheldon)  Fax:  [phone]

## 2015-07-01 HISTORY — PX: OTHER SURGICAL HISTORY: SHX169

## 2016-04-06 ENCOUNTER — Ambulatory Visit (INDEPENDENT_AMBULATORY_CARE_PROVIDER_SITE_OTHER): Payer: BLUE CROSS/BLUE SHIELD | Admitting: Adult Health

## 2016-04-06 ENCOUNTER — Encounter: Payer: Self-pay | Admitting: Adult Health

## 2016-04-06 VITALS — BP 118/78 | HR 93 | Ht 63.0 in | Wt 177.0 lb

## 2016-04-06 DIAGNOSIS — I10 Essential (primary) hypertension: Secondary | ICD-10-CM

## 2016-04-06 DIAGNOSIS — I739 Peripheral vascular disease, unspecified: Secondary | ICD-10-CM

## 2016-04-06 DIAGNOSIS — I2510 Atherosclerotic heart disease of native coronary artery without angina pectoris: Secondary | ICD-10-CM

## 2016-04-06 DIAGNOSIS — R072 Precordial pain: Secondary | ICD-10-CM | POA: Diagnosis not present

## 2016-04-06 NOTE — Progress Notes (Signed)
Name: Olivia [name]    DOB: [birth year]  Age: 55 y.o.  MR#: [id number]       PCP:  Joaquin CourtsFAVERO,JOHN PATRICK, DO      Insurance: Payor: BLUE CROSS BLUE [employer]IELD / Plan: BCBS OTHER / Product Type: *No Product type* /   CC:   No chief complaint on file.   VS Vitals:   04/06/16 1415  BP: 118/78  Pulse: 93  Weight: 177 lb (80.3 kg)  Height: 5\' 3"  (1.6 m)    Weights Current Weight  04/06/16 177 lb (80.3 kg)  06/10/15 186 lb (84.4 kg)  03/31/15 188 lb (85.3 kg)    Blood Pressure  BP Readings from Last 3 Encounters:  04/06/16 118/78  06/10/15 (!) 142/72  03/31/15 122/78     Admit date:  (Not on file) Last encounter with RMR:  Visit date not found   Allergy Patient has no known allergies.  Current Outpatient Prescriptions  Medication Sig Dispense Refill  . aspirin EC 81 MG tablet Take 81 mg by mouth daily.    . cyclobenzaprine (FLEXERIL) 10 MG tablet Take 1 tablet (10 mg total) by mouth 3 (three) times daily as needed for muscle spasms. 60 tablet 0  . gabapentin (NEURONTIN) 400 MG capsule Take 400 mg by mouth 3 (three) times daily.    Marland Kitchen. glipiZIDE (GLUCOTROL) 10 MG tablet Take 10 mg by mouth daily before breakfast.    . levothyroxine (SYNTHROID, LEVOTHROID) 25 MCG tablet Take 25 mcg by mouth daily before breakfast.    . lisinopril (PRINIVIL,ZESTRIL) 5 MG tablet Take 1 tablet (5 mg total) by mouth daily.    . metFORMIN (GLUCOPHAGE) 1000 MG tablet Take 1,000 mg by mouth 2 (two) times daily with a meal.    . metoprolol (LOPRESSOR) 50 MG tablet Take 1 tablet (50 mg total) by mouth 2 (two) times daily. 180 tablet 3  . nitroGLYCERIN (NITROSTAT) 0.4 MG SL tablet Place 1 tablet (0.4 mg total) under the tongue every 5 (five) minutes as needed for chest pain. 25 tablet 3  . pravastatin (PRAVACHOL) 40 MG tablet Take 40 mg by mouth daily.    . ranolazine (RANEXA) 500 MG 12 hr tablet Take 1 tablet (500 mg total) by mouth 2 (two) times daily. 60 tablet 6  . sitaGLIPtin (JANUVIA) 100 MG tablet Take 100  mg by mouth daily.    . traMADol (ULTRAM) 50 MG tablet Take 1 tablet (50 mg total) by mouth every 6 (six) hours as needed. 60 tablet 0  . vitamin E 400 UNIT capsule Take 400 Units by mouth daily.     No current facility-administered medications for this visit.     Discontinued Meds:    Medications Discontinued During This Encounter  Medication Reason  . cholecalciferol (VITAMIN D) 1000 UNITS tablet Error  . gabapentin (NEURONTIN) 300 MG capsule Error    Patient Active Problem List   Diagnosis Date Noted  . Well woman exam with routine gynecological exam 11/11/2014  . HNP (herniated nucleus pulposus), lumbar 06/28/2014    LABS    Component Value Date/Time   NA 138 06/19/2014 1010   NA 139 02/26/2011 0452   NA 139 02/25/2011 0320   K 4.1 06/19/2014 1010   K 3.9 02/26/2011 0452   K 3.5 02/25/2011 0320   CL 102 06/19/2014 1010   CL 104 02/26/2011 0452   CL 104 02/25/2011 0320   CO2 25 06/19/2014 1010   CO2 28 02/26/2011 0452   CO2 29 02/25/2011  0320   GLUCOSE 242 (H) 06/19/2014 1010   GLUCOSE 196 (H) 02/26/2011 0452   GLUCOSE 228 (H) 02/25/2011 0320   BUN 8 06/19/2014 1010   BUN 8 02/26/2011 0452   BUN 10 02/25/2011 0320   CREATININE 0.60 09/25/2014 0834   CREATININE 0.57 06/19/2014 1010   CREATININE 0.49 (L) 02/26/2011 0452   CALCIUM 9.2 06/19/2014 1010   CALCIUM 8.8 02/26/2011 0452   CALCIUM 8.8 02/25/2011 0320   GFRNONAA >90 06/19/2014 1010   GFRNONAA >90 02/26/2011 0452   GFRNONAA >90 02/25/2011 0320   GFRAA >90 06/19/2014 1010   GFRAA >90 02/26/2011 0452   GFRAA >90 02/25/2011 0320   CMP     Component Value Date/Time   NA 138 06/19/2014 1010   K 4.1 06/19/2014 1010   CL 102 06/19/2014 1010   CO2 25 06/19/2014 1010   GLUCOSE 242 (H) 06/19/2014 1010   BUN 8 06/19/2014 1010   CREATININE 0.60 09/25/2014 0834   CALCIUM 9.2 06/19/2014 1010   PROT 6.3 02/25/2011 0320   ALBUMIN 3.3 (L) 02/25/2011 0320   AST 46 (H) 02/25/2011 0320   ALT 36 (H) 02/25/2011  0320   ALKPHOS 110 02/25/2011 0320   BILITOT 0.3 02/25/2011 0320   GFRNONAA >90 06/19/2014 1010   GFRAA >90 06/19/2014 1010       Component Value Date/Time   WBC 10.2 06/19/2014 1010   WBC 12.0 (H) 02/26/2011 0452   WBC 11.2 (H) 02/25/2011 0320   HGB 16.3 (H) 06/19/2014 1010   HGB 15.9 (H) 02/26/2011 0452   HGB 15.7 (H) 02/25/2011 0320   HCT 47.0 (H) 06/19/2014 1010   HCT 44.5 02/26/2011 0452   HCT 43.4 02/25/2011 0320   MCV 87.2 06/19/2014 1010   MCV 87.9 02/26/2011 0452   MCV 87.3 02/25/2011 0320    Lipid Panel     Component Value Date/Time   CHOL 158 02/25/2011 0720   TRIG 190 (H) 02/25/2011 0720   HDL 31 (L) 02/25/2011 0720   CHOLHDL 5.1 02/25/2011 0720   VLDL 38 02/25/2011 0720   LDLCALC 89 02/25/2011 0720    ABG No results found for: PHART, PCO2ART, PO2ART, HCO3, TCO2, ACIDBASEDEF, O2SAT   Lab Results  Component Value Date   TSH 4.668 (H) 02/25/2011   BNP (last 3 results) No results for input(s): BNP in the last 8760 hours.  ProBNP (last 3 results) No results for input(s): PROBNP in the last 8760 hours.  Cardiac Panel (last 3 results) No results for input(s): CKTOTAL, CKMB, TROPONINI, RELINDX in the last 72 hours.  Iron/TIBC/Ferritin/ %Sat    Component Value Date/Time   IRON 92 08/21/2009 1213   TIBC 279 08/21/2009 1213   FERRITIN 310 (H) 08/21/2009 1213   IRONPCTSAT 33 08/21/2009 1213     EKG Orders placed or performed in visit on 04/06/16  . EKG 12-Lead     Prior Assessment and Plan Problem List as of 04/06/2016 Reviewed: 06/10/2015  3:21 PM by Prentice Docker, MD     Musculoskeletal and Integument   HNP (herniated nucleus pulposus), lumbar     Other   Well woman exam with routine gynecological exam       Imaging: No results found.

## 2016-04-06 NOTE — Progress Notes (Addendum)
Cardiology Office Note   Date:  04/06/2016   ID:  Olivia [name], DOB 1961/04/18, MRN [id number]  PCP:  Joaquin CourtsFAVERO,JOHN PATRICK, DO  Cardiologist: Inis SizerKoneswaran/  Murtaza Shell, NP   No chief complaint on file.     History of Present Illness: Olivia Rancheresa C Hoh is a 55 y.o. female who presents for ongoing assessment and management of coronary artery disease, non-ST elevation MI with drug-eluting stent to the proximal left circumflex on 02/25/2011, other history to include hypertension, hyperlipidemia, type 2 diabetes, peripheral vascular disease with occluded left internal carotid artery, and ongoing tobacco abuse. She had a follow-up nuclear medicine stress test on 06/21/2014 for preoperative risk stratification purposes which revealed no evidence of ischemia or infarction. The patient was started on Ranexa for recurrent angina. At that time she had no complaints on last visit 06/10/2015.Marland Kitchen.  She comes today with continued complaints of lower extremity pain and numbness. She states that she had ABIs and [place] doctor [place]. The results were to be faxed to her office. She is normally seen in [place], and therefore results may be faxed to that facility location.  Past Medical History:  Diagnosis Date  . Arthritis    back  . 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 Coronary artery disease   . Diabetes mellitus without complication (HCC)   . Hypertension   . Myocardial infarction 2012    Past Surgical History:  Procedure Laterality Date  . ABDOMINAL HYSTERECTOMY  2003   partial  . CARDIAC CATHETERIZATION  2012   DES pCX 02/25/11  . CHOLECYSTECTOMY  1995  . LUMBAR LAMINECTOMY/DECOMPRESSION MICRODISCECTOMY Right 06/28/2014   Procedure: LUMBAR LAMINECTOMY/DECOMPRESSION MICRODISCECTOMY 1 LEVEL;  Surgeon: Coletta MemosKyle Cabbell, MD;  Location: [facility];  Service: Neurosurgery;  Laterality: Right;   Right L5S1 far lateral microdiskectomy     Current Outpatient Prescriptions  Medication Sig Dispense Refill  . aspirin EC 81 MG tablet Take 81 mg by mouth daily.    . cyclobenzaprine (FLEXERIL) 10 MG tablet Take 1 tablet (10 mg total) by mouth 3 (three) times daily as needed for muscle spasms. 60 tablet 0  . gabapentin (NEURONTIN) 400 MG capsule Take 400 mg by mouth 3 (three) times daily.    Marland Kitchen. glipiZIDE (GLUCOTROL) 10 MG tablet Take 10 mg by mouth daily before breakfast.    . levothyroxine (SYNTHROID, LEVOTHROID) 25 MCG tablet Take 25 mcg by mouth daily before breakfast.    . lisinopril (PRINIVIL,ZESTRIL) 5 MG tablet Take 1 tablet (5 mg total) by mouth daily.    . metFORMIN (GLUCOPHAGE) 1000 MG tablet Take 1,000 mg by mouth 2 (two) times daily with a meal.    . metoprolol (LOPRESSOR) 50 MG tablet Take 1 tablet (50 mg total) by mouth 2 (two) times daily. 180 tablet 3  . nitroGLYCERIN (NITROSTAT) 0.4 MG SL tablet Place 1 tablet (0.4 mg total) under the tongue every 5 (five) minutes as needed for chest pain. 25 tablet 3  . pravastatin (PRAVACHOL) 40 MG tablet Take 40 mg by mouth daily.    . ranolazine (RANEXA) 500 MG 12 hr tablet Take 1 tablet (500 mg total) by mouth 2 (two) times daily. 60 tablet 6  . sitaGLIPtin (JANUVIA) 100 MG tablet Take 100 mg by mouth daily.    . traMADol (ULTRAM) 50 MG tablet Take 1 tablet (50 mg total) by mouth every 6 (six) hours as needed.  60 tablet 0  . vitamin E 400 UNIT capsule Take 400 Units by mouth daily.     No current facility-administered medications for this visit.     Allergies:   Patient has no known allergies.    Social History:  The patient  reports that she has been smoking Cigarettes.  She started smoking about 40 years ago. She has a 30.00 pack-year smoking history. She has never used smokeless tobacco. She reports that she does not drink alcohol or use drugs.   Family History:  The patient's family history includes Diabetes in her brother,  brother, father, and mother.    ROS: All other systems are reviewed and negative. Unless otherwise mentioned in H&P    PHYSICAL EXAM: VS:  BP 118/78   Pulse 93   Ht 5\' 3"  (1.6 m)   Wt 177 lb (80.3 kg)   BMI 31.35 kg/m  , BMI Body mass index is 31.35 kg/m. GEN: Well nourished, well developed, in no acute distress  HEENT: normal  Neck: no JVD, carotid bruits, or masses Cardiac: RRR; 1/6 systolic murmur heard at the apex, , rubs, or gallops,no edema  Respiratory:  Clear to auscultation bilaterally, normal work of breathing GI: soft, nontender, nondistended, + BS MS: no deformity or atrophy unable to palpate dorsalis pedis or posterior tibial pulses in lower extremities bilaterally. Doppler pulses are auscultated in the right dorsalis pedis, unable to auscultate pulse on the left. Skin: warm and dry, no rash Neuro:  Strength and sensation are intact Psych: euthymic mood, full affect  Recent Labs:   Lipid Panel    Component Value Date/Time   CHOL 158 02/25/2011 0720   TRIG 190 (H) 02/25/2011 0720   HDL 31 (L) 02/25/2011 0720   CHOLHDL 5.1 02/25/2011 0720   VLDL 38 02/25/2011 0720   LDLCALC 89 02/25/2011 0720      Wt Readings from Last 3 Encounters:  04/06/16 177 lb (80.3 kg)  06/10/15 186 lb (84.4 kg)  03/31/15 188 lb (85.3 kg)     ASSESSMENT AND PLAN:  1. Peripheral arterial disease: Abnormal Doppler pulses on examination of lower extremities in the office today. We are requesting the copy of the ABIs and echocardiogram which were completed in a physician's office in [place], [place]. The patient is normally seen in the [place] office and the results are likely there. She continues to have a good bit of numbness and pain in her legs bilaterally with walking and at rest. She has peripheral arterial disease elsewhere in the carotids. I suspicion for peripheral arterial disease of the lower extremities.  2. Coronary artery disease: Known history of non-ST elevation MI  with DES to the proximal left circumflex in October 2012. She offers no complaints of recurrent chest pain or dyspnea on exertion. Most recent nuclear medicine study completed January 2016 demonstrated an EF of 75%, no reversible ischemia or infarction was noted. It is with a low risk study. The patient will continue current medication regimen to include statin therapy and aspirin.  3. Hypertension: Currently very well-controlled on medication regimen. No changes   Current medicines are reviewed at length with the patient today.    Labs/ tests ordered today include:   Orders Placed This Encounter  Procedures  . EKG 12-Lead     Disposition:   FU with Dr. Purvis Sheffield in the [place] office in 6 months unless review of studies are abnormal.  ADDENDUM: My understanding. Received report faxed from physician's office which was dated 01/14/2016. (This  is scanned into the chart)  Lower extremity arterial:  Right ABI index 0.37, right left 0.41.  Narrative: Right leg arterial: The right ABI is 0.4 which is consistent with severe peripheral arterial disease. TBI is severely reduced at 0.2. PVRs, Doppler waveforms and segmental pressors suggest hemodynamically significant disease at the inflow aortic iliac level. Left leg arterial: The left ABI is 0.4 which is consistent with severe peripheral arterial disease. TBI severely reduced at 0.2 PVRs, Doppler waveforms and segmental pressures suggest hemodynamically significant disease at the inflow aortic iliac level.  Recommend CTA or invasive angiography for further evaluation. 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  The patient is called and is going to be referred to Dr. York Ram at the vascular service line at [facility]. A copy of this is also being faxed and scanned into the computer. The patient has been called and notified of the need for referral.   Signed, Joni Reining, NP   04/06/2016 2:41 PM    [facility] [address], [place], [place] [address] Phone: [phone]; Fax: [phone]

## 2016-04-06 NOTE — Patient Instructions (Signed)
Your physician wants you to follow-up in: 6 Months in [place]. You will receive a reminder letter in the mail two months in advance. If you don't receive a letter, please call our office to schedule the follow-up appointment.  Your physician recommends that you continue on your current medications as directed. Please refer to the Current Medication list given to you today.  If you need a refill on your cardiac medications before your next appointment, please call your pharmacy.  Thank you for choosing [facility]!

## 2016-04-09 ENCOUNTER — Other Ambulatory Visit: Payer: Self-pay

## 2016-04-09 DIAGNOSIS — R6889 Other general symptoms and signs: Secondary | ICD-10-CM

## 2016-04-21 ENCOUNTER — Other Ambulatory Visit: Payer: Self-pay | Admitting: Cardiovascular Disease

## 2016-04-27 ENCOUNTER — Telehealth: Payer: Self-pay | Admitting: Cardiovascular Disease

## 2016-04-27 NOTE — Telephone Encounter (Signed)
Received records from [facility] for appointment on 05/04/16 with Dr Allyson Sabal.  Records given to [facility] (medical records) for Dr Hazle Coca schedule on 05/04/16. lp

## 2016-05-04 ENCOUNTER — Ambulatory Visit (INDEPENDENT_AMBULATORY_CARE_PROVIDER_SITE_OTHER): Payer: BLUE CROSS/BLUE SHIELD | Admitting: Cardiovascular Disease

## 2016-05-04 ENCOUNTER — Encounter: Payer: Self-pay | Admitting: Cardiovascular Disease

## 2016-05-04 VITALS — BP 106/70 | HR 95 | Ht 63.0 in | Wt 177.6 lb

## 2016-05-04 DIAGNOSIS — I10 Essential (primary) hypertension: Secondary | ICD-10-CM | POA: Diagnosis not present

## 2016-05-04 DIAGNOSIS — I739 Peripheral vascular disease, unspecified: Secondary | ICD-10-CM | POA: Diagnosis not present

## 2016-05-04 DIAGNOSIS — E785 Hyperlipidemia, unspecified: Secondary | ICD-10-CM | POA: Insufficient documentation

## 2016-05-04 DIAGNOSIS — E119 Type 2 diabetes mellitus without complications: Secondary | ICD-10-CM | POA: Insufficient documentation

## 2016-05-04 DIAGNOSIS — I2510 Atherosclerotic heart disease of native coronary artery without angina pectoris: Secondary | ICD-10-CM | POA: Insufficient documentation

## 2016-05-04 DIAGNOSIS — I779 Disorder of arteries and arterioles, unspecified: Secondary | ICD-10-CM

## 2016-05-04 DIAGNOSIS — I2583 Coronary atherosclerosis due to lipid rich plaque: Secondary | ICD-10-CM

## 2016-05-04 NOTE — Patient Instructions (Addendum)
Medication Instructions: Your physician recommends that you continue on your current medications as directed. Please refer to the Current Medication list given to you today.  Testing/Procedures: Your physician has requested that you have a carotid duplex. This test is an ultrasound of the carotid arteries in your neck. It looks at blood flow through these arteries that supply the brain with blood. Allow one hour for this exam. There are no restrictions or special instructions.  Your physician has requested that you have a lower extremity arterial duplex. During this test, ultrasound is used to evaluate arterial blood flow in the legs. Allow one hour for this exam. There are no restrictions or special instructions.  Your physician has requested that you have an ankle brachial index (ABI). During this test an ultrasound and blood pressure cuff are used to evaluate the arteries that supply the arms and legs with blood. Allow thirty minutes for this exam. There are no restrictions or special instructions.  Follow-Up: Your physician recommends that you schedule a follow-up appointment with Dr. Allyson Sabal after testing.  If you need a refill on your cardiac medications before your next appointment, please call your pharmacy.

## 2016-05-04 NOTE — Progress Notes (Signed)
05/04/2016 Olivia [name]   1960-11-02  [id number]  Primary Physician Olivia Courts, DO Primary Cardiologist: Olivia Gess MD Olivia [name]  HPI:  Olivia [name] is a 55 year old married Caucasian female mother of 3, grandmother and 3 grandchildren referred to me by Olivia [name] registered nurse practitioner for evaluation of symptomatic arterial disease. Her cardiologist is Dr. Darl [name]. She lives in [place]. She has a history of coronary artery disease status post non-ST segment elevation myocardial infarction treated with drug-eluting stenting to the proximal left circumflex 02/25/11. She really denies chest pain or shortness of breath. She also has a history of tobacco abuse smoking one packs per day for the last 43 years (60 pack years), history of diabetes, hypertension and hyperlipidemia. She does have a strong family history heart disease. She has a known occluded left internal carotid artery. He complains of severe left Solomon claudication right greater than left feet with Dopplers performed in [place] recently that showed ABIs 0.4 bilaterally.   Current Outpatient Prescriptions  Medication Sig Dispense Refill  . aspirin EC 81 MG tablet Take 81 mg by mouth daily.    . cyclobenzaprine (FLEXERIL) 10 MG tablet Take 1 tablet (10 mg total) by mouth 3 (three) times daily as needed for muscle spasms. 60 tablet 0  . gabapentin (NEURONTIN) 400 MG capsule Take 400 mg by mouth 3 (three) times daily.    Marland Kitchen glipiZIDE (GLUCOTROL) 10 MG tablet Take 10 mg by mouth daily before breakfast.    . levothyroxine (SYNTHROID, LEVOTHROID) 25 MCG tablet Take 25 mcg by mouth daily before breakfast.    . lisinopril (PRINIVIL,ZESTRIL) 5 MG tablet Take 1 tablet (5 mg total) by mouth daily.    . metFORMIN (GLUCOPHAGE) 1000 MG tablet Take 1,000 mg by mouth 2 (two) times daily with a meal.    . metoprolol (LOPRESSOR) 50 MG tablet TAKE ONE TABLET BY MOUTH TWICE DAILY 180  tablet 3  . nitroGLYCERIN (NITROSTAT) 0.4 MG SL tablet Place 1 tablet (0.4 mg total) under the tongue every 5 (five) minutes as needed for chest pain. 25 tablet 3  . pravastatin (PRAVACHOL) 40 MG tablet Take 40 mg by mouth daily.    . ranolazine (RANEXA) 500 MG 12 hr tablet Take 1 tablet (500 mg total) by mouth 2 (two) times daily. 60 tablet 6  . sitaGLIPtin (JANUVIA) 100 MG tablet Take 100 mg by mouth daily.    . traMADol (ULTRAM) 50 MG tablet Take 1 tablet (50 mg total) by mouth every 6 (six) hours as needed. 60 tablet 0  . vitamin E 400 UNIT capsule Take 400 Units by mouth daily.     No current facility-administered medications for this visit.     No Known Allergies  Social History   Social History  . Marital status: Married    Spouse name: N/A  . Number of children: N/A  . Years of education: N/A   Occupational History  . Not on file.   Social History Main Topics  . Smoking status: Current Every Day Smoker    Packs/day: 1.00    Years: 30.00    Types: Cigarettes    Start date: 01/03/1976  . Smokeless tobacco: Never Used  . Alcohol use No  . Drug use: No  . Sexual activity: Not Currently    Birth control/ protection: Surgical   Other Topics Concern  . Not on file   Social History Narrative  . No narrative on file  Review of Systems: General: negative for chills, fever, night sweats or weight changes.  Cardiovascular: negative for chest pain, dyspnea on exertion, edema, orthopnea, palpitations, paroxysmal nocturnal dyspnea or shortness of breath Dermatological: negative for rash Respiratory: negative for cough or wheezing Urologic: negative for hematuria Abdominal: negative for nausea, vomiting, diarrhea, bright red blood per rectum, melena, or hematemesis Neurologic: negative for visual changes, syncope, or dizziness All other systems reviewed and are otherwise negative except as noted above.    Blood pressure 106/70, pulse 95, height 5\' 3"  (1.6 m), weight  177 lb 9.6 oz (80.6 kg).  General appearance: alert and no distress Neck: no adenopathy, no JVD, supple, symmetrical, trachea midline, thyroid not enlarged, symmetric, no tenderness/mass/nodules and Soft left carotid bruit Lungs: clear to auscultation bilaterally Heart: regular rate and rhythm, S1, S2 normal, no murmur, click, rub or gallop Extremities: extremities normal, atraumatic, no cyanosis or edema  EKG sinus rhythm at 95 with ST or T-wave changes. I proceeded. This EKG  ASSESSMENT AND PLAN:   Claudication [facility](HCC) This balloon has severe lifestyle limiting claudication. [place] that revealed a right ABI of 0.4 ABI 0.2 left ABI 0.4. She cannot walk more than 50-100 feet. Her right leg is worse than her left leg. Will get lower extremity peripheral Doppler studies to further define her anatomy.  Left-sided carotid artery disease (HCC)  history of known left ICA occlusion patient does have a bruit on the left. Will recheck carotid Doppler studies.       Olivia GessJonathan J. Latoia Eyster MD FACP,FACC,FAHA, [facility] 05/04/2016 3:05 PM

## 2016-05-04 NOTE — Assessment & Plan Note (Signed)
This balloon has severe lifestyle limiting claudication. She had Dopplers in [place] that revealed a right ABI of 0.4 ABI 0.2 left ABI 0.4. She cannot walk more than 50-100 feet. Her right leg is worse than her left leg. Will get lower extremity peripheral Doppler studies to further define her anatomy.

## 2016-05-04 NOTE — Assessment & Plan Note (Signed)
history of known left ICA occlusion patient does have a bruit on the left. Will recheck carotid Doppler studies.

## 2016-06-08 ENCOUNTER — Other Ambulatory Visit: Payer: Self-pay | Admitting: Cardiovascular Disease

## 2016-06-08 DIAGNOSIS — I739 Peripheral vascular disease, unspecified: Secondary | ICD-10-CM

## 2016-06-10 ENCOUNTER — Inpatient Hospital Stay (HOSPITAL_COMMUNITY): Admission: RE | Admit: 2016-06-10 | Payer: BLUE CROSS/BLUE SHIELD | Source: Ambulatory Visit

## 2016-06-10 ENCOUNTER — Encounter (HOSPITAL_COMMUNITY): Payer: BLUE CROSS/BLUE SHIELD

## 2016-06-18 ENCOUNTER — Ambulatory Visit: Payer: BLUE CROSS/BLUE SHIELD | Admitting: Cardiovascular Disease

## 2016-07-05 ENCOUNTER — Ambulatory Visit (HOSPITAL_COMMUNITY)
Admission: RE | Admit: 2016-07-05 | Discharge: 2016-07-05 | Disposition: A | Discharge status: Home / Self Care | Payer: BLUE CROSS/BLUE SHIELD | Source: Ambulatory Visit | Attending: Cardiovascular Disease | Admitting: Cardiovascular Disease

## 2016-07-05 DIAGNOSIS — I6523 Occlusion and stenosis of bilateral carotid arteries: Secondary | ICD-10-CM | POA: Diagnosis not present

## 2016-07-05 DIAGNOSIS — R9439 Abnormal result of other cardiovascular function study: Secondary | ICD-10-CM | POA: Diagnosis not present

## 2016-07-05 DIAGNOSIS — I7409 Other arterial embolism and thrombosis of abdominal aorta: Secondary | ICD-10-CM | POA: Diagnosis not present

## 2016-07-05 DIAGNOSIS — I745 Embolism and thrombosis of iliac artery: Secondary | ICD-10-CM | POA: Diagnosis not present

## 2016-07-05 DIAGNOSIS — I739 Peripheral vascular disease, unspecified: Secondary | ICD-10-CM | POA: Insufficient documentation

## 2016-07-05 DIAGNOSIS — I779 Disorder of arteries and arterioles, unspecified: Secondary | ICD-10-CM

## 2016-07-07 ENCOUNTER — Ambulatory Visit (INDEPENDENT_AMBULATORY_CARE_PROVIDER_SITE_OTHER): Payer: BLUE CROSS/BLUE SHIELD | Admitting: Cardiovascular Disease

## 2016-07-07 ENCOUNTER — Encounter: Payer: Self-pay | Admitting: Cardiovascular Disease

## 2016-07-07 VITALS — BP 121/75 | HR 74 | Ht 63.0 in | Wt 176.6 lb

## 2016-07-07 DIAGNOSIS — I739 Peripheral vascular disease, unspecified: Secondary | ICD-10-CM | POA: Diagnosis not present

## 2016-07-07 DIAGNOSIS — Z01810 Encounter for preprocedural cardiovascular examination: Secondary | ICD-10-CM

## 2016-07-07 DIAGNOSIS — Q254 Congenital malformation of aorta unspecified: Secondary | ICD-10-CM | POA: Diagnosis not present

## 2016-07-07 LAB — BASIC METABOLIC PANEL WITH GFR
BUN: 11 mg/dL (ref 7–25)
CALCIUM: 9.7 mg/dL (ref 8.6–10.4)
CO2: 25 mmol/L (ref 20–31)
Chloride: 103 mmol/L (ref 98–110)
Creat: 0.73 mg/dL (ref 0.50–1.05)
GLUCOSE: 244 mg/dL — AB (ref 65–99)
Potassium: 4.6 mmol/L (ref 3.5–5.3)
Sodium: 138 mmol/L (ref 135–146)

## 2016-07-07 NOTE — Progress Notes (Signed)
[name] returns today for follow-up of her outpatient studies. Her carotid Doppler showed occluded left internal carotid carotid artery with high-grade right ICA stenosis as well as an upper extremity blood pressure differential suggesting right subclavian or innominate disease as well. She'll most likely need right carotid endarterectomy. I am going to get a CTA of her neck and thoracic aorta. In addition, her low actually Doppler suggests distal abdominal aortic occlusion. Her ABIs at an outside hospital were 0.4 bilaterally. She is symptomatic right greater than left. I'm going to get a CTA of her abdomen and pelvis to further characterize her anatomy. I'll plan on performing angiography and potential intervention after her carotid artery disease is addressed. I'm also going to get a 2-D echocardiogram and a pharmacologic Myoview stress test for preoperative evaluation.

## 2016-07-07 NOTE — Addendum Note (Signed)
Addended by: Evans Lance on: 07/07/2016 03:17 PM   Modules accepted: Orders

## 2016-07-07 NOTE — Patient Instructions (Signed)
Your physician has requested that you have a lexiscan myoview. For further information please visit [email or web address] Please follow instruction sheet, as given.  Your physician has requested that you have an echocardiogram. Echocardiography is a painless test that uses sound waves to create images of your heart. It provides your doctor with information about the size and shape of your heart and how well your heart's chambers and valves are working. This procedure takes approximately one hour. There are no restrictions for this procedure.  Non-Cardiac CT Angiography (CTA),--of Head, Neck, Abdomen, and pelvis-- is a special type of CT scan that uses a computer to produce multi-dimensional views of major blood vessels throughout the body. In CT angiography, a contrast material is injected through an IV to help visualize the blood vessels   You have been referred to Dr. Myra Gianotti with [facility].  Please schedule appt.

## 2016-07-07 NOTE — Assessment & Plan Note (Signed)
History of claudication with Dopplers performed 07/05/16 revealing what appears to be an occluded distal abdominal aorta. She had Dopplers performed in [facility] showed ABIs of 0.4. I am going to get up to abdominal and pelvic CTA to further characterize her anatomy.

## 2016-07-07 NOTE — Assessment & Plan Note (Signed)
History of left internal carotid artery occlusion with recent carotid Doppler performed 07/05/16 revealing a high-grade right ICA stenosis with upper extremity blood pressure differential. She will need carotid endarterectomy. I'm going to get a CTA of her neck and aortic arch. I'm also going to get a Myoview stress test to risk stratify her.

## 2016-07-07 NOTE — Addendum Note (Signed)
Addended by: Evans Lance on: 07/07/2016 11:00 AM   Modules accepted: Orders

## 2016-07-07 NOTE — Addendum Note (Signed)
Addended by: Evans Lance on: 07/07/2016 11:23 AM   Modules accepted: Orders

## 2016-07-07 NOTE — Addendum Note (Signed)
Addended by: Evans Lance on: 07/07/2016 10:57 AM   Modules accepted: Orders

## 2016-07-13 ENCOUNTER — Ambulatory Visit (INDEPENDENT_AMBULATORY_CARE_PROVIDER_SITE_OTHER)
Admission: RE | Admit: 2016-07-13 | Discharge: 2016-07-13 | Disposition: A | Discharge status: Home / Self Care | Payer: BLUE CROSS/BLUE SHIELD | Source: Ambulatory Visit | Attending: Cardiovascular Disease | Admitting: Cardiovascular Disease

## 2016-07-13 ENCOUNTER — Encounter: Payer: Self-pay | Admitting: Surgery

## 2016-07-13 ENCOUNTER — Other Ambulatory Visit: Payer: BLUE CROSS/BLUE SHIELD | Admitting: *Deleted

## 2016-07-13 ENCOUNTER — Telehealth: Payer: Self-pay | Admitting: Cardiovascular Disease

## 2016-07-13 ENCOUNTER — Ambulatory Visit (HOSPITAL_COMMUNITY): Payer: BLUE CROSS/BLUE SHIELD

## 2016-07-13 DIAGNOSIS — I739 Peripheral vascular disease, unspecified: Secondary | ICD-10-CM

## 2016-07-13 DIAGNOSIS — I10 Essential (primary) hypertension: Secondary | ICD-10-CM

## 2016-07-13 DIAGNOSIS — Z01810 Encounter for preprocedural cardiovascular examination: Secondary | ICD-10-CM | POA: Diagnosis not present

## 2016-07-13 DIAGNOSIS — Q254 Congenital malformation of aorta unspecified: Secondary | ICD-10-CM

## 2016-07-13 MED ORDER — IOPAMIDOL (ISOVUE-370) INJECTION 76%
100.0000 mL | Freq: Once | INTRAVENOUS | Status: AC | PRN
  Administered 2016-07-13: 100 mL via INTRAVENOUS

## 2016-07-13 NOTE — Telephone Encounter (Signed)
I SPOKE WITH PATIENT TO LET HER KNOW I WAS ABLE TO OBTAIN A PRECERT FOR HER ECHO FROM [employer]  TODAY. I SPOKE WITH OUR SCH TO GET HER RESCH FOR THIS TEST. THE PATIENT  UNDERSTOOD AND STATED THAT WE CAN LEAVE A MESSAGE WITH HER HUSBAND TO LET HER KNOW WHEN HER ECHO WILL BE

## 2016-07-13 NOTE — Addendum Note (Signed)
Addended by: Evans Lance on: 07/13/2016 09:25 AM   Modules accepted: Orders

## 2016-07-13 NOTE — Addendum Note (Signed)
Addended by: Tonita Phoenix on: 07/13/2016 03:26 PM   Modules accepted: Orders

## 2016-07-14 ENCOUNTER — Telehealth (HOSPITAL_COMMUNITY): Payer: Self-pay

## 2016-07-14 LAB — BASIC METABOLIC PANEL
BUN / CREAT RATIO: 28 — AB (ref 9–23)
BUN: 20 mg/dL (ref 6–24)
CHLORIDE: 94 mmol/L — AB (ref 96–106)
CO2: 23 mmol/L (ref 18–29)
Calcium: 9.2 mg/dL (ref 8.7–10.2)
Creatinine, Ser: 0.72 mg/dL (ref 0.57–1.00)
GFR calc Af Amer: 108 (ref >59)
GFR calc non Af Amer: 94 (ref >59)
GLUCOSE: 421 mg/dL — AB (ref 65–99)
Potassium: 4.2 mmol/L (ref 3.5–5.2)
Sodium: 137 mmol/L (ref 134–144)

## 2016-07-14 NOTE — Telephone Encounter (Signed)
Encounter complete. 

## 2016-07-15 ENCOUNTER — Ambulatory Visit (HOSPITAL_COMMUNITY)
Admission: RE | Admit: 2016-07-15 | Discharge: 2016-07-15 | Disposition: A | Discharge status: Home / Self Care | Payer: BLUE CROSS/BLUE SHIELD | Source: Ambulatory Visit | Attending: Cardiovascular Disease | Admitting: Cardiovascular Disease

## 2016-07-15 ENCOUNTER — Ambulatory Visit (INDEPENDENT_AMBULATORY_CARE_PROVIDER_SITE_OTHER)
Admission: RE | Admit: 2016-07-15 | Discharge: 2016-07-15 | Disposition: A | Discharge status: Home / Self Care | Payer: BLUE CROSS/BLUE SHIELD | Source: Ambulatory Visit | Attending: Cardiovascular Disease | Admitting: Cardiovascular Disease

## 2016-07-15 ENCOUNTER — Telehealth: Payer: Self-pay | Admitting: Cardiovascular Disease

## 2016-07-15 ENCOUNTER — Ambulatory Visit (HOSPITAL_BASED_OUTPATIENT_CLINIC_OR_DEPARTMENT_OTHER)
Admission: RE | Admit: 2016-07-15 | Discharge: 2016-07-15 | Disposition: A | Discharge status: Home / Self Care | Payer: BLUE CROSS/BLUE SHIELD | Source: Ambulatory Visit | Attending: Cardiovascular Disease | Admitting: Cardiovascular Disease

## 2016-07-15 DIAGNOSIS — I313 Pericardial effusion (noninflammatory): Secondary | ICD-10-CM | POA: Insufficient documentation

## 2016-07-15 DIAGNOSIS — Q254 Congenital malformation of aorta unspecified: Secondary | ICD-10-CM

## 2016-07-15 DIAGNOSIS — Z01810 Encounter for preprocedural cardiovascular examination: Secondary | ICD-10-CM | POA: Diagnosis not present

## 2016-07-15 DIAGNOSIS — I501 Left ventricular failure: Secondary | ICD-10-CM | POA: Diagnosis not present

## 2016-07-15 DIAGNOSIS — I739 Peripheral vascular disease, unspecified: Secondary | ICD-10-CM

## 2016-07-15 LAB — MYOCARDIAL PERFUSION IMAGING
CHL CUP RESTING HR STRESS: 74 {beats}/min
CSEPPHR: 84 {beats}/min
LV sys vol: 17 mL
LVDIAVOL: 54 mL (ref 46–106)
SDS: 9
SRS: 5
SSS: 12
TID: 1.14

## 2016-07-15 MED ORDER — TECHNETIUM TC 99M TETROFOSMIN IV KIT
28.8000 | PACK | Freq: Once | INTRAVENOUS | Status: AC | PRN
  Administered 2016-07-15: 28.8 via INTRAVENOUS
  Filled 2016-07-15: qty 29

## 2016-07-15 MED ORDER — REGADENOSON 0.4 MG/5ML IV SOLN
0.4000 mg | Freq: Once | INTRAVENOUS | Status: AC
  Administered 2016-07-15: 0.4 mg via INTRAVENOUS

## 2016-07-15 MED ORDER — IOPAMIDOL (ISOVUE-370) INJECTION 76%
125.0000 mL | Freq: Once | INTRAVENOUS | Status: AC | PRN
  Administered 2016-07-15: 125 mL via INTRAVENOUS

## 2016-07-15 MED ORDER — TECHNETIUM TC 99M TETROFOSMIN IV KIT
9.8000 | PACK | Freq: Once | INTRAVENOUS | Status: AC | PRN
  Administered 2016-07-15: 9.8 via INTRAVENOUS
  Filled 2016-07-15: qty 10

## 2016-07-15 NOTE — Telephone Encounter (Signed)
New message      Calling to give CT call report to a nurse

## 2016-07-15 NOTE — Telephone Encounter (Signed)
Patient had ct angio AO+BIFEM   CALLED REPORT by radiology,  Pay special attention to the  NON- VASCULAR PART OF REPORT   routed/DEFERRED  information to Dr Allyson Sabal

## 2016-07-15 NOTE — Progress Notes (Signed)
  Echocardiogram 2D Echocardiogram has been performed.  Delcie Roch 07/15/2016, 8:59 AM

## 2016-07-16 ENCOUNTER — Other Ambulatory Visit: Payer: Self-pay | Admitting: Cardiovascular Disease

## 2016-07-16 ENCOUNTER — Telehealth: Payer: Self-pay | Admitting: Cardiovascular Disease

## 2016-07-16 DIAGNOSIS — I739 Peripheral vascular disease, unspecified: Secondary | ICD-10-CM

## 2016-07-16 DIAGNOSIS — M79604 Pain in right leg: Secondary | ICD-10-CM

## 2016-07-16 DIAGNOSIS — R200 Anesthesia of skin: Secondary | ICD-10-CM

## 2016-07-16 DIAGNOSIS — I779 Disorder of arteries and arterioles, unspecified: Secondary | ICD-10-CM

## 2016-07-16 NOTE — Telephone Encounter (Signed)
Basic metabolic panel  Order: [id number]  Status:  Final result Visible to patient:  No (Not Released) Dx:  Essential hypertension  Notes Recorded by Evans Lance on 07/16/2016 at 1:59 PM EST LMTCB ------  Notes Recorded by Evans Lance on 07/15/2016 at 12:50 PM EST Routed to PCP via EPIC. Unable to reach pt at this time. Will attempt to call again on 07/15/16. ------  Notes Recorded by Runell Gess, MD on 07/14/2016 at 1:18 PM EST Labs okay except for markedly elevated serum glucose. Forward to PCP    CT ANGIO AO+BIFEM W & OR WO CONTRAST  Order: [id number]  Status:  Final result Visible to patient:  No (Not Released) Dx:  Pre-operative cardiovascular examinat...  Notes Recorded by Evans Lance on 07/16/2016 at 1:58 PM EST LMTCB ------  Notes Recorded by Runell Gess, MD on 07/15/2016 at 4:04 PM EST Occluded infrarenal abdominal aorta (Leriche syndrome).    Myocardial Perfusion Imaging  Order: [id number]  Status:  Final result Visible to patient:  No (Not Released) Dx:  Pre-operative cardiovascular examination  Notes Recorded by Evans Lance on 07/16/2016 at 1:58 PM EST LMTCB ------  Notes Recorded by Evans Lance on 07/15/2016 at 2:38 PM EST lmtcb for results ------  Notes Recorded by Runell Gess, MD on 07/15/2016 at 2:10 PM EST Call and tell normal

## 2016-07-16 NOTE — Telephone Encounter (Signed)
Follow Up ° °Pt returning call from earlier. Please call. °

## 2016-07-16 NOTE — Telephone Encounter (Signed)
New Message    Please call pt is calling for her test results , Olivia [name] had called her earlier and she missed the call

## 2016-07-16 NOTE — Telephone Encounter (Signed)
Ordered CT to have done in 6 months.

## 2016-07-16 NOTE — Telephone Encounter (Signed)
Repeat noncontrast chest CT in 6 months to follow pulmonary nodules.

## 2016-07-16 NOTE — Telephone Encounter (Signed)
lmtcb

## 2016-07-16 NOTE — Telephone Encounter (Deleted)
-----   Message from Runell Gess, MD sent at 07/14/2016  1:18 PM EST ----- Labs okay except for markedly elevated serum glucose. Forward to PCP

## 2016-07-16 NOTE — Telephone Encounter (Signed)
Will route to taylor for follow up

## 2016-07-19 ENCOUNTER — Other Ambulatory Visit: Payer: Self-pay

## 2016-07-19 ENCOUNTER — Encounter: Payer: Self-pay | Admitting: Surgery

## 2016-07-19 ENCOUNTER — Ambulatory Visit (INDEPENDENT_AMBULATORY_CARE_PROVIDER_SITE_OTHER): Payer: BLUE CROSS/BLUE SHIELD | Admitting: Surgery

## 2016-07-19 VITALS — BP 141/84 | HR 72 | Temp 97.0°F | Resp 18 | Ht 63.0 in | Wt 180.0 lb

## 2016-07-19 DIAGNOSIS — I6521 Occlusion and stenosis of right carotid artery: Secondary | ICD-10-CM | POA: Diagnosis not present

## 2016-07-19 NOTE — Progress Notes (Signed)
[facility] [place]  Patient name: Olivia [name] MRN: [id number] DOB: 1960/06/22 Sex: female   REFERRING PROVIDER:    Dr. Allyson Sabal   REASON FOR CONSULT:    Carotid stenosis Aortic occlusion  HISTORY OF PRESENT ILLNESS:   Olivia [name] is a 56 y.o. female, who is Referred today for evaluation of vascular insufficiency.  The patient states that she began having difficulty and March 2017.  She states that she gets cramping in her buttock region as well as the loss of feeling and sensation in her legs.  She has claudication like symptoms and 50 feet.  Her right leg bothers her more than the left.  She also complains of some numbness in her right arm.  She denies any neurologic symptoms such as loss of function of extremity, amaurosis fugax, or dysphagia.  The patient has a history of a STEMI and is status post PCI in 2012.  She has a significant family history for cardiovascular disease.  She is a current smoker, smoking 1-1/2 packs per day.  She suffers from diabetes which is not well controlled.  Her A1c has been greater than 9 in the past.  She is on a statin for hypercholesterolemia and an ACE inhibitor for hypertension.  PAST MEDICAL HISTORY    Past Medical History:  Diagnosis Date  . Arthritis    back  . 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 Coronary artery disease   . Diabetes mellitus without complication (HCC)   . Hypertension   . Myocardial infarction 2012     FAMILY HISTORY   Family History  Problem Relation Age of Onset  . Diabetes Mother   . Diabetes Father   . Diabetes Brother   . Diabetes Brother     SOCIAL HISTORY:   Social History   Social History  . Marital status: Married    Spouse name: N/A  . Number of children: N/A  . Years of education: N/A   Occupational History  . Not on file.   Social History Main Topics  .  Smoking status: Current Every Day Smoker    Packs/day: 1.00    Years: 30.00    Types: Cigarettes    Start date: 01/03/1976  . Smokeless tobacco: Never Used  . Alcohol use No  . Drug use: No  . Sexual activity: Not Currently    Birth control/ protection: Surgical   Other Topics Concern  . Not on file   Social History Narrative  . No narrative on file    ALLERGIES:    No Known Allergies  CURRENT MEDICATIONS:    Current Outpatient Prescriptions  Medication Sig Dispense Refill  . aspirin EC 81 MG tablet Take 81 mg by mouth daily.    . cyclobenzaprine (FLEXERIL) 10 MG tablet Take 1 tablet (10 mg total) by mouth 3 (three) times daily as needed for muscle spasms. 60 tablet 0  . gabapentin (NEURONTIN) 400 MG capsule Take 400 mg by mouth 3 (three) times daily.    Marland Kitchen glipiZIDE (GLUCOTROL) 10 MG tablet Take 10 mg by mouth daily before breakfast.    . levothyroxine (SYNTHROID, LEVOTHROID) 25 MCG tablet Take 25 mcg by mouth daily before breakfast.    . lisinopril (PRINIVIL,ZESTRIL) 5 MG tablet Take 1 tablet (5 mg total) by mouth daily.    . metFORMIN (GLUCOPHAGE) 1000 MG tablet Take 1,000 mg by mouth 2 (two) times  daily with a meal.    . metoprolol (LOPRESSOR) 50 MG tablet TAKE ONE TABLET BY MOUTH TWICE DAILY 180 tablet 3  . nitroGLYCERIN (NITROSTAT) 0.4 MG SL tablet Place 1 tablet (0.4 mg total) under the tongue every 5 (five) minutes as needed for chest pain. 25 tablet 3  . pravastatin (PRAVACHOL) 40 MG tablet Take 40 mg by mouth daily.    . ranolazine (RANEXA) 500 MG 12 hr tablet Take 1 tablet (500 mg total) by mouth 2 (two) times daily. 60 tablet 6  . sitaGLIPtin (JANUVIA) 100 MG tablet Take 100 mg by mouth daily.    . traMADol (ULTRAM) 50 MG tablet Take 1 tablet (50 mg total) by mouth every 6 (six) hours as needed. 60 tablet 0  . vitamin E 400 UNIT capsule Take 400 Units by mouth daily.     No current facility-administered medications for this visit.     REVIEW OF SYSTEMS:   [X]   denotes positive finding, [ ]  denotes negative finding Cardiac  Comments:  Chest pain or chest pressure:    Shortness of breath upon exertion:    Short of breath when lying flat:    Irregular heart rhythm:        Vascular    Pain in calf, thigh, or hip brought on by ambulation: x   Pain in feet at night that wakes you up from your sleep:     Blood clot in your veins:    Leg swelling:         Pulmonary    Oxygen at home:    Productive cough:     Wheezing:         Neurologic    Sudden weakness in arms or legs:     Sudden numbness in arms or legs:     Sudden onset of difficulty speaking or slurred speech:    Temporary loss of vision in one eye:     Problems with dizziness:         Gastrointestinal    Blood in stool:      Vomited blood:         Genitourinary    Burning when urinating:     Blood in urine:        Psychiatric    Major depression:         Hematologic    Bleeding problems:    Problems with blood clotting too easily:        Skin    Rashes or ulcers:        Constitutional    Fever or chills:     PHYSICAL EXAM:   Vitals:   07/19/16 0835 07/19/16 0836  BP: 106/82 (!) 141/84  Pulse: 72   Resp: 18   Temp: 97 F (36.1 C)   TempSrc: Oral   SpO2: 97%   Weight: 180 lb (81.6 kg)   Height: 5\' 3"  (1.6 m)     GENERAL: The patient is a well-nourished female, in no acute distress. The vital signs are documented above. CARDIAC: There is a regular rate and rhythm.  VASCULAR: no palpable pulse in right radial or brachial.  Needle and femoral pulses are not palpable.  No carotid bruits. PULMONARY: Nonlabored respirations ABDOMEN: Soft and non-tender with normal pitched bowel sounds.  MUSCULOSKELETAL: There are no major deformities or cyanosis. NEUROLOGIC: No focal weakness or paresthesias are detected. SKIN: There are no ulcers or rashes noted. PSYCHIATRIC: The patient has a normal affect.  STUDIES:  Carotid duplex: Occluded left carotid, greater than  80% right carotid CT angiogram neck: I have reviewed these images which showed an occluded left carotid artery with a 75% right carotid stenosis with the bifurcation the mid neck.  No innominate or right subclavian stenosis was identified CT angiogram abdomen and pelvis: Occluded infrarenal aorta with reconstitution of the external iliac artery bilaterally.   ASSESSMENT and PLAN   Carotid stenosis: I have discussed proceeding with a right carotid endarterectomy.  I discussed the risks and benefits of the operation with the patient including the risk of stroke, nerve injury, and cardiopulmonary complications.  All of her questions were answered.  This will be scheduled for Friday, March 2  Aortic occlusion: I discussed that we would proceed with aortobifemoral bypass, once she has recovered from her carotid endarterectomy.  She would like to get both of these done as soon as possible.   Durene Cal, MD [facility] [phone] Pager [phone]

## 2016-07-22 ENCOUNTER — Encounter (HOSPITAL_COMMUNITY): Payer: Self-pay

## 2016-07-22 ENCOUNTER — Encounter (HOSPITAL_COMMUNITY)
Admission: RE | Admit: 2016-07-22 | Discharge: 2016-07-22 | Disposition: A | Discharge status: Home / Self Care | Payer: BLUE CROSS/BLUE SHIELD | Source: Ambulatory Visit | Attending: Surgery | Admitting: Surgery

## 2016-07-22 HISTORY — DX: Hypothyroidism, unspecified: E03.9

## 2016-07-22 LAB — COMPREHENSIVE METABOLIC PANEL
ALK PHOS: 80 U/L (ref 38–126)
ALT: 15 U/L (ref 14–54)
AST: 21 U/L (ref 15–41)
Albumin: 3.8 g/dL (ref 3.5–5.0)
Anion gap: 13 (ref 5–15)
BILIRUBIN TOTAL: 0.5 mg/dL (ref 0.3–1.2)
BUN: 7 mg/dL (ref 6–20)
CALCIUM: 9.2 mg/dL (ref 8.9–10.3)
CO2: 21 mmol/L — ABNORMAL LOW (ref 22–32)
CREATININE: 0.62 mg/dL (ref 0.44–1.00)
Chloride: 104 mmol/L (ref 101–111)
GFR calc non Af Amer: >60 mL/min (ref >60)
Glucose, Bld: 109 mg/dL — ABNORMAL HIGH (ref 65–99)
Potassium: 4.1 mmol/L (ref 3.5–5.1)
Sodium: 138 mmol/L (ref 135–145)
Total Protein: 7.2 g/dL (ref 6.5–8.1)

## 2016-07-22 LAB — URINALYSIS, ROUTINE W REFLEX MICROSCOPIC
Bacteria, UA: NONE SEEN
Bilirubin Urine: NEGATIVE
Hgb urine dipstick: NEGATIVE
KETONES UR: 5 mg/dL — AB
LEUKOCYTES UA: NEGATIVE
Nitrite: NEGATIVE
Protein, ur: NEGATIVE mg/dL
Specific Gravity, Urine: 1.029 (ref 1.005–1.030)
pH: 5 (ref 5.0–8.0)

## 2016-07-22 LAB — GLUCOSE, CAPILLARY: Glucose-Capillary: 116 mg/dL — ABNORMAL HIGH (ref 65–99)

## 2016-07-22 LAB — TYPE AND SCREEN
ABO/RH(D): B POS
Antibody Screen: NEGATIVE

## 2016-07-22 LAB — PROTIME-INR
INR: 0.88
PROTHROMBIN TIME: 11.9 s (ref 11.4–15.2)

## 2016-07-22 LAB — SURGICAL PCR SCREEN
MRSA, PCR: NEGATIVE
Staphylococcus aureus: POSITIVE — AB

## 2016-07-22 LAB — APTT: aPTT: 29 seconds (ref 24–36)

## 2016-07-22 LAB — CBC
HCT: 49.7 % — ABNORMAL HIGH (ref 36.0–46.0)
HEMOGLOBIN: 17 g/dL — AB (ref 12.0–15.0)
MCH: 30.4 pg (ref 26.0–34.0)
MCHC: 34.2 g/dL (ref 30.0–36.0)
MCV: 88.9 fL (ref 78.0–100.0)
Platelets: 244 10*3/uL (ref 150–400)
RBC: 5.59 MIL/uL — AB (ref 3.87–5.11)
RDW: 13.7 % (ref 11.5–15.5)
WBC: 8.8 10*3/uL (ref 4.0–10.5)

## 2016-07-22 LAB — ABO/RH: ABO/RH(D): B POS

## 2016-07-22 NOTE — Pre-Procedure Instructions (Addendum)
Olivia [name]  07/22/2016    Your procedure is scheduled on Friday, March 2.  Report to [facility] Admitting at  7:30 AM                  Your surgery or procedure is scheduled for 9:30 AM   Call this number if you have problems the morning of surgery: [phone]               For any other questions, please call [phone], Monday - Friday 8 AM - 4 PM.     Remember:  Do not eat food or drink liquids after midnight.  Take these medicines the morning of surgery with A SIP OF WATER : gabapentin (NEURONTIN), levothyroxine (SYNTHROID, LEVOTHROID),  metoprolol (LOPRESSOR), pravastatin (PRAVACHOL).               Do Not take Oral (pills) for diabetes the morning of surgery.                May take Nitroglycerin if needed.            How to Manage Your Diabetes Before and After Surgery  Why is it important to control my blood sugar before and after surgery? . Improving blood sugar levels before and after surgery helps healing and can limit problems. . A way of improving blood sugar control is eating a healthy diet by: o  Eating less sugar and carbohydrates o  Increasing activity/exercise o  Talking with your doctor about reaching your blood sugar goals . High blood sugars (greater than 180 mg/dL) can raise your risk of infections and slow your recovery, so you will need to focus on controlling your diabetes during the weeks before surgery. . Make sure that the doctor who takes care of your diabetes knows about your planned surgery including the date and location.  How do I manage my blood sugar before surgery? . Check your blood sugar at least 4 times a day, starting 2 days before surgery, to make sure that the level is not too high or low. o Check your blood sugar the morning of your surgery when you wake up and every 2 hours until you get to the Short Stay unit. . If your blood sugar is less than 70 mg/dL, you will need to treat for low blood sugar: o Do not take  insulin. o Treat a low blood sugar (less than 70 mg/dL) with  cup of clear juice (cranberry or apple), 4 glucose tablets, OR glucose gel. o Recheck blood sugar in 15 minutes after treatment (to make sure it is greater than 70 mg/dL). If your blood sugar is not greater than 70 mg/dL on recheck, call [phone] for further instructions. . Report your blood sugar to the short stay nurse when you get to Short Stay.  . If you are admitted to the hospital after surgery: o Your blood sugar will be checked by the staff and you will probably be given insulin after surgery (instead of oral diabetes medicines) to make sure you have good blood sugar levels. o The goal for blood sugar control after surgery is 80-180 mg/  WHAT DO I DO ABOUT MY DIABETES MEDICATION?   Marland Kitchen Do not take oral diabetes medicines (pills) the morning of surgery. .   Do not wear jewelry, make up or nail polish.  Do not wear lotions, powders, or perfumes, or deodorant.  Do not shave 48 hours prior to surgery.  Do not bring valuables to the hospital.  [facility] is not responsible for any belongings or valuables.  Contacts, dentures or bridgework may not be worn into surgery.  Leave your suitcase in the car.  After surgery it may be brought to your room.  For patients admitted to the hospital, discharge time will be determined by your treatment team.  Special instructions:  Review  [facility] - Preparing For Surgery.  Please read over the following fact sheets that you were given: [facility]- Preparing For Surgery and Patient Instructions for Mupirocin Application, Incentive Spirometry, Pain Booklet

## 2016-07-23 ENCOUNTER — Inpatient Hospital Stay (HOSPITAL_COMMUNITY): Payer: BLUE CROSS/BLUE SHIELD | Admitting: Certified Registered Nurse Anesthetist

## 2016-07-23 ENCOUNTER — Encounter (HOSPITAL_COMMUNITY): Payer: Self-pay | Admitting: *Deleted

## 2016-07-23 ENCOUNTER — Encounter (HOSPITAL_COMMUNITY)
Admission: RE | Disposition: A | Discharge status: Home / Self Care | Payer: Self-pay | Source: Ambulatory Visit | Attending: Surgery

## 2016-07-23 ENCOUNTER — Inpatient Hospital Stay (HOSPITAL_COMMUNITY)
Admission: RE | Admit: 2016-07-23 | Discharge: 2016-07-24 | DRG: 039 | Disposition: A | Discharge status: Home / Self Care | Payer: BLUE CROSS/BLUE SHIELD | Source: Ambulatory Visit | Attending: Surgery | Admitting: Surgery

## 2016-07-23 DIAGNOSIS — I2583 Coronary atherosclerosis due to lipid rich plaque: Secondary | ICD-10-CM

## 2016-07-23 DIAGNOSIS — Z833 Family history of diabetes mellitus: Secondary | ICD-10-CM

## 2016-07-23 DIAGNOSIS — I998 Other disorder of circulatory system: Secondary | ICD-10-CM | POA: Diagnosis present

## 2016-07-23 DIAGNOSIS — R112 Nausea with vomiting, unspecified: Secondary | ICD-10-CM | POA: Diagnosis not present

## 2016-07-23 DIAGNOSIS — Z9861 Coronary angioplasty status: Secondary | ICD-10-CM

## 2016-07-23 DIAGNOSIS — E1151 Type 2 diabetes mellitus with diabetic peripheral angiopathy without gangrene: Secondary | ICD-10-CM | POA: Diagnosis present

## 2016-07-23 DIAGNOSIS — I6523 Occlusion and stenosis of bilateral carotid arteries: Secondary | ICD-10-CM | POA: Diagnosis present

## 2016-07-23 DIAGNOSIS — R197 Diarrhea, unspecified: Secondary | ICD-10-CM | POA: Diagnosis not present

## 2016-07-23 DIAGNOSIS — Z79899 Other long term (current) drug therapy: Secondary | ICD-10-CM | POA: Diagnosis not present

## 2016-07-23 DIAGNOSIS — Z8249 Family history of ischemic heart disease and other diseases of the circulatory system: Secondary | ICD-10-CM | POA: Diagnosis not present

## 2016-07-23 DIAGNOSIS — E7800 Pure hypercholesterolemia, unspecified: Secondary | ICD-10-CM | POA: Diagnosis present

## 2016-07-23 DIAGNOSIS — F17210 Nicotine dependence, cigarettes, uncomplicated: Secondary | ICD-10-CM | POA: Diagnosis present

## 2016-07-23 DIAGNOSIS — I2510 Atherosclerotic heart disease of native coronary artery without angina pectoris: Secondary | ICD-10-CM | POA: Diagnosis present

## 2016-07-23 DIAGNOSIS — E039 Hypothyroidism, unspecified: Secondary | ICD-10-CM | POA: Diagnosis present

## 2016-07-23 DIAGNOSIS — Z87891 Personal history of nicotine dependence: Secondary | ICD-10-CM

## 2016-07-23 DIAGNOSIS — E1165 Type 2 diabetes mellitus with hyperglycemia: Secondary | ICD-10-CM | POA: Diagnosis present

## 2016-07-23 DIAGNOSIS — I10 Essential (primary) hypertension: Secondary | ICD-10-CM | POA: Diagnosis present

## 2016-07-23 DIAGNOSIS — Z7982 Long term (current) use of aspirin: Secondary | ICD-10-CM

## 2016-07-23 DIAGNOSIS — Z7984 Long term (current) use of oral hypoglycemic drugs: Secondary | ICD-10-CM

## 2016-07-23 DIAGNOSIS — I6521 Occlusion and stenosis of right carotid artery: Secondary | ICD-10-CM

## 2016-07-23 DIAGNOSIS — I252 Old myocardial infarction: Secondary | ICD-10-CM | POA: Diagnosis not present

## 2016-07-23 HISTORY — PX: PATCH ANGIOPLASTY: SHX6230

## 2016-07-23 HISTORY — PX: ENDARTERECTOMY: SHX5162

## 2016-07-23 LAB — C DIFFICILE QUICK SCREEN W PCR REFLEX
C DIFFICILE (CDIFF) INTERP: NOT DETECTED
C DIFFICILE (CDIFF) TOXIN: NEGATIVE
C DIFFICLE (CDIFF) ANTIGEN: NEGATIVE

## 2016-07-23 LAB — GLUCOSE, CAPILLARY
GLUCOSE-CAPILLARY: 155 mg/dL — AB (ref 65–99)
GLUCOSE-CAPILLARY: 126 mg/dL — AB (ref 65–99)
GLUCOSE-CAPILLARY: 101 mg/dL — AB (ref 65–99)
GLUCOSE-CAPILLARY: 218 mg/dL — AB (ref 65–99)

## 2016-07-23 LAB — MRSA PCR SCREENING: MRSA BY PCR: NEGATIVE

## 2016-07-23 SURGERY — ENDARTERECTOMY, CAROTID
Anesthesia: General | Site: Neck | Laterality: Right

## 2016-07-23 MED ORDER — ALUM & MAG HYDROXIDE-SIMETH 200-200-20 MG/5ML PO SUSP: 15.0000 mL | ORAL | Status: DC | PRN

## 2016-07-23 MED ORDER — 0.9 % SODIUM CHLORIDE (POUR BTL) OPTIME
TOPICAL | Status: DC | PRN
  Administered 2016-07-23: 2000 mL

## 2016-07-23 MED ORDER — ACETAMINOPHEN 650 MG RE SUPP: 325.0000 mg | RECTAL | Status: DC | PRN

## 2016-07-23 MED ORDER — OXYCODONE HCL 5 MG PO TABS: 5.0000 mg | ORAL_TABLET | Freq: Once | ORAL | Status: DC | PRN

## 2016-07-23 MED ORDER — LIDOCAINE 2% (20 MG/ML) 5 ML SYRINGE
INTRAMUSCULAR | Status: DC | PRN
  Administered 2016-07-23: 80 mg via INTRAVENOUS

## 2016-07-23 MED ORDER — PHENYLEPHRINE 40 MCG/ML (10ML) SYRINGE FOR IV PUSH (FOR BLOOD PRESSURE SUPPORT)
PREFILLED_SYRINGE | INTRAVENOUS | Status: AC
  Filled 2016-07-23: qty 10

## 2016-07-23 MED ORDER — SODIUM CHLORIDE 0.9 % IV SOLN
0.0125 ug/kg/min | INTRAVENOUS | Status: DC
  Filled 2016-07-23: qty 2000

## 2016-07-23 MED ORDER — PHENYLEPHRINE 40 MCG/ML (10ML) SYRINGE FOR IV PUSH (FOR BLOOD PRESSURE SUPPORT)
PREFILLED_SYRINGE | INTRAVENOUS | Status: DC | PRN
  Administered 2016-07-23: 40 ug via INTRAVENOUS

## 2016-07-23 MED ORDER — SUGAMMADEX SODIUM 200 MG/2ML IV SOLN
INTRAVENOUS | Status: AC
  Filled 2016-07-23: qty 2

## 2016-07-23 MED ORDER — PROMETHAZINE HCL 25 MG/ML IJ SOLN
6.2500 mg | Freq: Once | INTRAMUSCULAR | Status: AC | PRN
Start: 2016-07-23 — End: 2016-07-23
  Administered 2016-07-23: 6.25 mg via INTRAVENOUS

## 2016-07-23 MED ORDER — METOPROLOL TARTRATE 5 MG/5ML IV SOLN
2.0000 mg | INTRAVENOUS | Status: DC | PRN
Start: 2016-07-23 — End: 2016-07-23

## 2016-07-23 MED ORDER — LISINOPRIL 5 MG PO TABS
5.0000 mg | ORAL_TABLET | Freq: Every day | ORAL | Status: DC
  Administered 2016-07-24: 5 mg via ORAL
  Filled 2016-07-23: qty 1

## 2016-07-23 MED ORDER — PANTOPRAZOLE SODIUM 40 MG PO TBEC
40.0000 mg | DELAYED_RELEASE_TABLET | Freq: Every day | ORAL | Status: DC
  Administered 2016-07-24: 40 mg via ORAL
  Filled 2016-07-23: qty 1

## 2016-07-23 MED ORDER — MAGNESIUM SULFATE 2 GM/50ML IV SOLN: 2.0000 g | Freq: Every day | INTRAVENOUS | Status: DC | PRN

## 2016-07-23 MED ORDER — HYDRALAZINE HCL 20 MG/ML IJ SOLN: 5.0000 mg | INTRAMUSCULAR | Status: DC | PRN

## 2016-07-23 MED ORDER — INSULIN ASPART 100 UNIT/ML ~~LOC~~ SOLN
0.0000 [IU] | Freq: Three times a day (TID) | SUBCUTANEOUS | Status: DC
  Administered 2016-07-24: 2 [IU] via SUBCUTANEOUS

## 2016-07-23 MED ORDER — SUGAMMADEX SODIUM 200 MG/2ML IV SOLN
INTRAVENOUS | Status: DC | PRN
  Administered 2016-07-23: 165 mg via INTRAVENOUS

## 2016-07-23 MED ORDER — PROPOFOL 10 MG/ML IV BOLUS
INTRAVENOUS | Status: AC
  Filled 2016-07-23: qty 20

## 2016-07-23 MED ORDER — FENTANYL CITRATE (PF) 250 MCG/5ML IJ SOLN
INTRAMUSCULAR | Status: AC
  Filled 2016-07-23: qty 5

## 2016-07-23 MED ORDER — SODIUM CHLORIDE 0.9 % IV SOLN
INTRAVENOUS | Status: DC | PRN
  Administered 2016-07-23: 500 mL

## 2016-07-23 MED ORDER — LABETALOL HCL 5 MG/ML IV SOLN
10.0000 mg | Freq: Once | INTRAVENOUS | Status: AC
  Administered 2016-07-23: 10 mg via INTRAVENOUS

## 2016-07-23 MED ORDER — LIDOCAINE 2% (20 MG/ML) 5 ML SYRINGE
INTRAMUSCULAR | Status: AC
  Filled 2016-07-23: qty 5

## 2016-07-23 MED ORDER — OXYCODONE-ACETAMINOPHEN 5-325 MG PO TABS: 1.0000 | ORAL_TABLET | Freq: Four times a day (QID) | ORAL | 0 refills | Status: DC | PRN

## 2016-07-23 MED ORDER — HYDRALAZINE HCL 20 MG/ML IJ SOLN
INTRAMUSCULAR | Status: AC
  Filled 2016-07-23: qty 1

## 2016-07-23 MED ORDER — SODIUM CHLORIDE 0.9 % IV SOLN: INTRAVENOUS | Status: DC

## 2016-07-23 MED ORDER — HYDRALAZINE HCL 20 MG/ML IJ SOLN
10.0000 mg | Freq: Once | INTRAMUSCULAR | Status: AC
  Administered 2016-07-23: 10 mg via INTRAVENOUS

## 2016-07-23 MED ORDER — ASPIRIN EC 81 MG PO TBEC
81.0000 mg | DELAYED_RELEASE_TABLET | Freq: Every day | ORAL | Status: DC
  Administered 2016-07-24: 81 mg via ORAL
  Filled 2016-07-23: qty 1

## 2016-07-23 MED ORDER — VITAMIN E 180 MG (400 UNIT) PO CAPS
400.0000 [IU] | ORAL_CAPSULE | Freq: Every day | ORAL | Status: DC
  Administered 2016-07-24: 400 [IU] via ORAL
  Filled 2016-07-23: qty 1

## 2016-07-23 MED ORDER — PROTAMINE SULFATE 10 MG/ML IV SOLN
INTRAVENOUS | Status: DC | PRN
  Administered 2016-07-23: 10 mg via INTRAVENOUS
  Administered 2016-07-23 (×2): 20 mg via INTRAVENOUS

## 2016-07-23 MED ORDER — ONDANSETRON HCL 4 MG/2ML IJ SOLN
INTRAMUSCULAR | Status: AC
  Filled 2016-07-23: qty 2

## 2016-07-23 MED ORDER — FENTANYL CITRATE (PF) 100 MCG/2ML IJ SOLN
INTRAMUSCULAR | Status: DC | PRN
  Administered 2016-07-23: 25 ug via INTRAVENOUS
  Administered 2016-07-23 (×2): 100 ug via INTRAVENOUS
  Administered 2016-07-23: 25 ug via INTRAVENOUS

## 2016-07-23 MED ORDER — CHLORHEXIDINE GLUCONATE CLOTH 2 % EX PADS: 6.0000 | MEDICATED_PAD | Freq: Once | CUTANEOUS | Status: DC

## 2016-07-23 MED ORDER — ESMOLOL HCL 100 MG/10ML IV SOLN
INTRAVENOUS | Status: DC | PRN
  Administered 2016-07-23: 30 mg via INTRAVENOUS
  Administered 2016-07-23: 20 mg via INTRAVENOUS

## 2016-07-23 MED ORDER — SODIUM CHLORIDE 0.9 % IV SOLN: 500.0000 mL | Freq: Once | INTRAVENOUS | Status: DC | PRN

## 2016-07-23 MED ORDER — FENTANYL CITRATE (PF) 100 MCG/2ML IJ SOLN
INTRAMUSCULAR | Status: AC
  Administered 2016-07-23: 50 ug via INTRAVENOUS
  Filled 2016-07-23: qty 2

## 2016-07-23 MED ORDER — GABAPENTIN 400 MG PO CAPS
400.0000 mg | ORAL_CAPSULE | Freq: Three times a day (TID) | ORAL | Status: DC
  Administered 2016-07-23 – 2016-07-24 (×2): 400 mg via ORAL
  Filled 2016-07-23 (×2): qty 1

## 2016-07-23 MED ORDER — LACTATED RINGERS IV SOLN: INTRAVENOUS | Status: DC

## 2016-07-23 MED ORDER — ONDANSETRON HCL 4 MG/2ML IJ SOLN
4.0000 mg | Freq: Four times a day (QID) | INTRAMUSCULAR | Status: DC | PRN
  Administered 2016-07-23: 4 mg via INTRAVENOUS

## 2016-07-23 MED ORDER — SODIUM CHLORIDE 0.9 % IV SOLN
0.0125 ug/kg/min | INTRAVENOUS | Status: AC
  Administered 2016-07-23: .07 ug/kg/min via INTRAVENOUS

## 2016-07-23 MED ORDER — DOCUSATE SODIUM 100 MG PO CAPS
100.0000 mg | ORAL_CAPSULE | Freq: Every day | ORAL | Status: DC
  Administered 2016-07-24: 100 mg via ORAL
  Filled 2016-07-23: qty 1

## 2016-07-23 MED ORDER — ACETAMINOPHEN 325 MG PO TABS
325.0000 mg | ORAL_TABLET | ORAL | Status: DC | PRN
  Administered 2016-07-24: 650 mg via ORAL
  Filled 2016-07-23: qty 2

## 2016-07-23 MED ORDER — ROCURONIUM BROMIDE 50 MG/5ML IV SOSY
PREFILLED_SYRINGE | INTRAVENOUS | Status: AC
  Filled 2016-07-23: qty 5

## 2016-07-23 MED ORDER — HEPARIN SODIUM (PORCINE) 1000 UNIT/ML IJ SOLN
INTRAMUSCULAR | Status: DC | PRN
  Administered 2016-07-23: 8000 [IU] via INTRAVENOUS
  Administered 2016-07-23: 1000 [IU] via INTRAVENOUS

## 2016-07-23 MED ORDER — MUPIROCIN 2 % EX OINT
TOPICAL_OINTMENT | CUTANEOUS | Status: AC
  Filled 2016-07-23: qty 22

## 2016-07-23 MED ORDER — METOPROLOL TARTRATE 50 MG PO TABS
50.0000 mg | ORAL_TABLET | Freq: Two times a day (BID) | ORAL | Status: DC
  Administered 2016-07-23 – 2016-07-24 (×2): 50 mg via ORAL
  Filled 2016-07-23 (×2): qty 1

## 2016-07-23 MED ORDER — FENTANYL CITRATE (PF) 100 MCG/2ML IJ SOLN
25.0000 ug | INTRAMUSCULAR | Status: DC | PRN
  Administered 2016-07-23 (×2): 50 ug via INTRAVENOUS

## 2016-07-23 MED ORDER — HYDROMORPHONE HCL 1 MG/ML IJ SOLN
INTRAMUSCULAR | Status: AC
  Administered 2016-07-23: 0.25 mg
  Filled 2016-07-23: qty 0.5

## 2016-07-23 MED ORDER — METOPROLOL TARTRATE 12.5 MG HALF TABLET: 50.0000 mg | ORAL_TABLET | Freq: Once | ORAL | Status: DC

## 2016-07-23 MED ORDER — HEMOSTATIC AGENTS (NO CHARGE) OPTIME
TOPICAL | Status: DC | PRN
  Administered 2016-07-23: 1 via TOPICAL

## 2016-07-23 MED ORDER — SODIUM CHLORIDE 0.9 % IV SOLN
INTRAVENOUS | Status: DC
  Administered 2016-07-23: 21:00:00 via INTRAVENOUS

## 2016-07-23 MED ORDER — OXYCODONE-ACETAMINOPHEN 5-325 MG PO TABS: 1.0000 | ORAL_TABLET | ORAL | Status: DC | PRN

## 2016-07-23 MED ORDER — GUAIFENESIN-DM 100-10 MG/5ML PO SYRP: 15.0000 mL | ORAL_SOLUTION | ORAL | Status: DC | PRN

## 2016-07-23 MED ORDER — PHENOL 1.4 % MT LIQD: 1.0000 | OROMUCOSAL | Status: DC | PRN

## 2016-07-23 MED ORDER — CANAGLIFLOZIN 100 MG PO TABS
100.0000 mg | ORAL_TABLET | Freq: Every day | ORAL | Status: DC
  Administered 2016-07-24: 100 mg via ORAL
  Filled 2016-07-23: qty 1

## 2016-07-23 MED ORDER — NITROGLYCERIN 0.4 MG SL SUBL: 0.4000 mg | SUBLINGUAL_TABLET | SUBLINGUAL | Status: DC | PRN

## 2016-07-23 MED ORDER — LABETALOL HCL 5 MG/ML IV SOLN
INTRAVENOUS | Status: DC | PRN
  Administered 2016-07-23 (×2): 20 mg via INTRAVENOUS

## 2016-07-23 MED ORDER — PHENYLEPHRINE 40 MCG/ML (10ML) SYRINGE FOR IV PUSH (FOR BLOOD PRESSURE SUPPORT)
PREFILLED_SYRINGE | INTRAVENOUS | Status: AC
  Filled 2016-07-23: qty 20

## 2016-07-23 MED ORDER — LIDOCAINE HCL (PF) 1 % IJ SOLN
INTRAMUSCULAR | Status: AC
  Filled 2016-07-23: qty 30

## 2016-07-23 MED ORDER — LABETALOL HCL 5 MG/ML IV SOLN
10.0000 mg | INTRAVENOUS | Status: DC | PRN
  Administered 2016-07-23 (×2): 10 mg via INTRAVENOUS
  Filled 2016-07-23 (×2): qty 4

## 2016-07-23 MED ORDER — PROMETHAZINE HCL 25 MG/ML IJ SOLN
INTRAMUSCULAR | Status: AC
  Administered 2016-07-23: 6.25 mg via INTRAVENOUS
  Filled 2016-07-23: qty 1

## 2016-07-23 MED ORDER — ROCURONIUM BROMIDE 100 MG/10ML IV SOLN
INTRAVENOUS | Status: DC | PRN
  Administered 2016-07-23: 50 mg via INTRAVENOUS

## 2016-07-23 MED ORDER — MUPIROCIN 2 % EX OINT
1.0000 "application " | TOPICAL_OINTMENT | Freq: Once | CUTANEOUS | Status: AC
  Administered 2016-07-23: 1 via TOPICAL

## 2016-07-23 MED ORDER — ROCURONIUM BROMIDE 50 MG/5ML IV SOSY
PREFILLED_SYRINGE | INTRAVENOUS | Status: AC
  Filled 2016-07-23: qty 10

## 2016-07-23 MED ORDER — METFORMIN HCL 500 MG PO TABS
1000.0000 mg | ORAL_TABLET | Freq: Two times a day (BID) | ORAL | Status: DC
  Administered 2016-07-24: 1000 mg via ORAL
  Filled 2016-07-23: qty 2

## 2016-07-23 MED ORDER — ONDANSETRON HCL 4 MG/2ML IJ SOLN
INTRAMUSCULAR | Status: DC | PRN
  Administered 2016-07-23: 4 mg via INTRAVENOUS

## 2016-07-23 MED ORDER — POTASSIUM CHLORIDE CRYS ER 20 MEQ PO TBCR: 20.0000 meq | EXTENDED_RELEASE_TABLET | Freq: Every day | ORAL | Status: DC | PRN

## 2016-07-23 MED ORDER — PRAVASTATIN SODIUM 40 MG PO TABS
40.0000 mg | ORAL_TABLET | Freq: Every day | ORAL | Status: DC
  Administered 2016-07-23: 40 mg via ORAL
  Filled 2016-07-23: qty 1

## 2016-07-23 MED ORDER — LACTATED RINGERS IV SOLN
INTRAVENOUS | Status: DC
  Administered 2016-07-23 (×2): via INTRAVENOUS

## 2016-07-23 MED ORDER — CEFUROXIME SODIUM 1.5 G IJ SOLR
1.5000 g | INTRAMUSCULAR | Status: AC
  Administered 2016-07-23: 1.5 g via INTRAVENOUS
  Filled 2016-07-23: qty 1.5

## 2016-07-23 MED ORDER — OXYCODONE HCL 5 MG/5ML PO SOLN: 5.0000 mg | Freq: Once | ORAL | Status: DC | PRN

## 2016-07-23 MED ORDER — POLYETHYLENE GLYCOL 3350 17 G PO PACK: 17.0000 g | PACK | Freq: Every day | ORAL | Status: DC | PRN

## 2016-07-23 MED ORDER — LABETALOL HCL 5 MG/ML IV SOLN
INTRAVENOUS | Status: AC
  Administered 2016-07-23: 10 mg via INTRAVENOUS
  Filled 2016-07-23: qty 4

## 2016-07-23 MED ORDER — BISACODYL 10 MG RE SUPP
10.0000 mg | Freq: Every day | RECTAL | Status: DC | PRN
Start: 2016-07-23 — End: 2016-07-24

## 2016-07-23 MED ORDER — PROPOFOL 10 MG/ML IV BOLUS
INTRAVENOUS | Status: DC | PRN
  Administered 2016-07-23: 120 mg via INTRAVENOUS

## 2016-07-23 MED ORDER — PHENYLEPHRINE HCL 10 MG/ML IJ SOLN
INTRAVENOUS | Status: DC | PRN
  Administered 2016-07-23: 25 ug/min via INTRAVENOUS

## 2016-07-23 MED ORDER — DEXTROSE 5 % IV SOLN
1.5000 g | Freq: Two times a day (BID) | INTRAVENOUS | Status: AC
  Administered 2016-07-23 – 2016-07-24 (×2): 1.5 g via INTRAVENOUS
  Filled 2016-07-23 (×2): qty 1.5

## 2016-07-23 MED ORDER — PHENYLEPHRINE HCL 10 MG/ML IJ SOLN
INTRAMUSCULAR | Status: DC | PRN
  Administered 2016-07-23 (×2): 40 ug via INTRAVENOUS
  Administered 2016-07-23: 80 ug via INTRAVENOUS

## 2016-07-23 MED ORDER — MORPHINE SULFATE (PF) 2 MG/ML IV SOLN: 2.0000 mg | INTRAVENOUS | Status: DC | PRN

## 2016-07-23 MED ORDER — LABETALOL HCL 5 MG/ML IV SOLN
INTRAVENOUS | Status: AC
  Filled 2016-07-23: qty 4

## 2016-07-23 MED ORDER — GLIPIZIDE 5 MG PO TABS
10.0000 mg | ORAL_TABLET | Freq: Every day | ORAL | Status: DC
  Administered 2016-07-24: 10 mg via ORAL
  Filled 2016-07-23: qty 2

## 2016-07-23 MED ORDER — LEVOTHYROXINE SODIUM 25 MCG PO TABS
25.0000 ug | ORAL_TABLET | Freq: Every day | ORAL | Status: DC
  Administered 2016-07-24: 25 ug via ORAL
  Filled 2016-07-23: qty 1

## 2016-07-23 MED ORDER — HEPARIN SODIUM (PORCINE) 1000 UNIT/ML IJ SOLN
INTRAMUSCULAR | Status: AC
  Filled 2016-07-23: qty 2

## 2016-07-23 SURGICAL SUPPLY — 52 items
ADH SKN CLS APL DERMABOND .7 (GAUZE/BANDAGES/DRESSINGS) ×1
CANISTER SUCT 3000ML PPV (MISCELLANEOUS) ×2 IMPLANT
CATH ROBINSON RED A/P 18FR (CATHETERS) ×2 IMPLANT
CATH SUCT 10FR WHISTLE TIP (CATHETERS) ×2 IMPLANT
CLIP TI MEDIUM 6 (CLIP) ×2 IMPLANT
CLIP TI WIDE RED SMALL 6 (CLIP) ×3 IMPLANT
COVER PROBE W GEL 5X96 (DRAPES) ×1 IMPLANT
CRADLE DONUT ADULT HEAD (MISCELLANEOUS) ×2 IMPLANT
DERMABOND ADVANCED (GAUZE/BANDAGES/DRESSINGS) ×1
DERMABOND ADVANCED .7 DNX12 (GAUZE/BANDAGES/DRESSINGS) ×1 IMPLANT
DRAIN CHANNEL 15F RND FF W/TCR (WOUND CARE) IMPLANT
ELECT REM PT RETURN 9FT ADLT (ELECTROSURGICAL) ×2
ELECTRODE REM PT RTRN 9FT ADLT (ELECTROSURGICAL) ×1 IMPLANT
EVACUATOR SILICONE 100CC (DRAIN) IMPLANT
GLOVE BIO SURGEON STRL SZ 6.5 (GLOVE) ×2 IMPLANT
GLOVE BIOGEL PI IND STRL 6.5 (GLOVE) IMPLANT
GLOVE BIOGEL PI IND STRL 7.0 (GLOVE) IMPLANT
GLOVE BIOGEL PI IND STRL 7.5 (GLOVE) ×1 IMPLANT
GLOVE BIOGEL PI INDICATOR 6.5 (GLOVE) ×1
GLOVE BIOGEL PI INDICATOR 7.0 (GLOVE) ×4
GLOVE BIOGEL PI INDICATOR 7.5 (GLOVE) ×1
GLOVE ECLIPSE 6.5 STRL STRAW (GLOVE) ×3 IMPLANT
GLOVE SURG SS PI 6.5 STRL IVOR (GLOVE) ×1 IMPLANT
GLOVE SURG SS PI 7.5 STRL IVOR (GLOVE) ×2 IMPLANT
GOWN STRL NON-REIN LRG LVL3 (GOWN DISPOSABLE) ×1 IMPLANT
GOWN STRL REUS W/ TWL LRG LVL3 (GOWN DISPOSABLE) ×2 IMPLANT
GOWN STRL REUS W/ TWL XL LVL3 (GOWN DISPOSABLE) ×1 IMPLANT
GOWN STRL REUS W/TWL LRG LVL3 (GOWN DISPOSABLE) ×8
GOWN STRL REUS W/TWL XL LVL3 (GOWN DISPOSABLE) ×2
HEMOSTAT SNOW SURGICEL 2X4 (HEMOSTASIS) ×1 IMPLANT
INSERT FOGARTY SM (MISCELLANEOUS) IMPLANT
KIT BASIN OR (CUSTOM PROCEDURE TRAY) ×2 IMPLANT
KIT ROOM TURNOVER OR (KITS) ×2 IMPLANT
NDL HYPO 25GX1X1/2 BEV (NEEDLE) IMPLANT
NEEDLE HYPO 25GX1X1/2 BEV (NEEDLE) IMPLANT
NS IRRIG 1000ML POUR BTL (IV SOLUTION) ×5 IMPLANT
PACK CAROTID (CUSTOM PROCEDURE TRAY) ×2 IMPLANT
PAD ARMBOARD 7.5X6 YLW CONV (MISCELLANEOUS) ×4 IMPLANT
PATCH VASC XENOSURE 1CMX6CM (Vascular Products) ×2 IMPLANT
PATCH VASC XENOSURE 1X6 (Vascular Products) IMPLANT
SHUNT CAROTID BYPASS 10 (VASCULAR PRODUCTS) IMPLANT
SHUNT CAROTID BYPASS 12FRX15.5 (VASCULAR PRODUCTS) IMPLANT
SUT ETHILON 3 0 PS 1 (SUTURE) IMPLANT
SUT PROLENE 6 0 BV (SUTURE) ×3 IMPLANT
SUT PROLENE 7 0 BV 1 (SUTURE) IMPLANT
SUT SILK 3 0 (SUTURE)
SUT SILK 3-0 18XBRD TIE 12 (SUTURE) IMPLANT
SUT VIC AB 3-0 SH 27 (SUTURE) ×6
SUT VIC AB 3-0 SH 27X BRD (SUTURE) ×2 IMPLANT
SUT VICRYL 4-0 PS2 18IN ABS (SUTURE) ×2 IMPLANT
SYR CONTROL 10ML LL (SYRINGE) IMPLANT
WATER STERILE IRR 1000ML POUR (IV SOLUTION) ×2 IMPLANT

## 2016-07-23 NOTE — Progress Notes (Signed)
  [facility] Day of Surgery Note  Subjective:  Patient seen in PACU. Having nausea and vomiting.   Vitals:   07/23/16 1630 07/23/16 1633  BP: (!) 156/66 130/60  Pulse: 85 88  Resp: 13 15  Temp: 97.9 F (36.6 C)    Right neck without hematoma Tongue midline, no facial droop. Moving all extremities equally.   Assessment/Plan:  This is a 56 y.o. female who is s/p right carotid endarterectomy  Neuro exam intact.  Hypertension improved after one dose of hydralazine.  Two episodes of diarrhea in PACU. Will check c.diff.     Maris Berger, [place] Pager: [phone] 07/23/2016 4:48 PM

## 2016-07-23 NOTE — Op Note (Signed)
Patient name: Olivia [name] MRN: [id number] DOB: 1960/12/07 Sex: female  07/23/2016 Pre-operative Diagnosis: asymptomatic   right carotid stenosis Post-operative diagnosis:  Same Surgeon:  Durene Cal Assistants:  S. Rhyne Procedure:    right carotid Endarterectomy with bovine pericardial  patch angioplasty Anesthesia:  General Blood Loss:  See anesthesia record Specimens:  none  Findings:  80 %stenosis; Thrombus:  none  Indications:  56 yo with an occluded left ICA and 80% right ICA stenosis.  She also has an occluded aorta with severe claudication  Procedure:  The patient was identified in the holding area and taken to [facility] OR ROOM 16  The patient was then placed supine on the table.   General endotrachial anesthesia was administered.  The patient was prepped and draped in the usual sterile fashion.  A time out was called and antibiotics were administered.  The incision was made along the anterior border of the right sternocleidomastoid muscle.  Cautery was used to dissect through the subcutaneous tissue.  The platysma muscle was divided with cautery.  The internal jugular vein was exposed along its anterior medial border.  The common facial vein was exposed and then divided between 2-0 silk ties and metal clips.  The common carotid artery was then circumferentially exposed and encircled with an umbilical tape.  The vagus nerve was identified and protected.  Next sharp dissection was used to expose the external carotid artery and the superior thyroid artery.  The were encircled with a blue vessel loop and a 2-0 silk tie respectively.  Finally, the internal carotid was carefully dissected free.  An umbilical tape was placed around the internal carotid artery distal to the diseased segment.  The hypoglossal nerve was visualized throughout and protected.  The patient was given systemic heparinization.  A bovine carotid patch was selected and prepared on the back table.  A 10 french shunt was also  prepared.  After blood pressure readings were appropriate and the heparin had been given time to circulate, the internal carotid artery was occluded with a baby Gregory clamp.  The external and common carotid arteries were then occluded with vascular clamps and the 2-0 tie tightened on the superior thyroid artery.  A #11 blade was used to make an arteriotomy in the common carotid artery.  This was extended with Potts scissors along the anterior and lateral border of the common and internal carotid artery.  Approximately 80% stenosis was identified.  There was no thrombus identified.  The 10 french shunt was then placed.  A kleiner kuntz elevator was used to perform endarterectomy.  An eversion endarterectomy was performed in the external carotid artery.  A good distal endpoint was obtained in the internal carotid artery.  The specimen was removed and sent to pathology.  Heparinized saline was used to irrigate the endarterectomized field.  All potential embolic debris was removed.  In order to get adequate visualization of the distal endpoint because of how high the lesion was, I had to remove the shunt.  We kept her SBP > 160 for this portion.  I did not replace the shunt, as I felt she had adequate backbleeding, and the shunt was preventing adequate exposure of the distal ICA.  Bovine pericardial patch angioplasty was then performed using a running 6-0 Prolene.  The common internal and external carotid arteries were all appropriately flushed. The artery was again irrigated with heparin saline.  The anastomosis was then secured. The clamp was first released on the external carotid  artery followed by the common carotid artery approximately 30 seconds later, bloodflow was reestablish through the internal carotid artery.  Next, a hand-held  Doppler was used to evaluate the signals in the common, external, and internal  carotid arteries, all of which had appropriate signals. I then administered  50 mg protamine. The  wound was then irrigated.  After hemostasis was achieved, the carotid sheath was reapproximated with 3-0 Vicryl. The  platysma muscle was reapproximated with running 3-0 Vicryl. The skin  was closed with 4-0 Vicryl. Dermabond was placed on the skin. The  patient was then successfully extubated. His neurologic exam was  similar to his preprocedural exam. The patient was then taken to recovery room  in stable condition. There were no complications.     Disposition:  To PACU in stable condition.  Relevant Operative Details:  Shunt had to be removed for adequate visualization of the distal ICA due to how high the lesion extended.  She appeared to have adequate back bleeding from the ICA.  Hypoglossal nerve was visualized and protected  V. Durene Cal, M.D. [facility] [place] Office: [phone] Pager:  [phone]

## 2016-07-23 NOTE — Progress Notes (Signed)
Report to Maria T. RN as primary caregiver. 

## 2016-07-23 NOTE — Anesthesia Preprocedure Evaluation (Signed)
Anesthesia Evaluation  Patient identified by MRN, date of birth, ID band Patient awake    Reviewed: Allergy & Precautions, NPO status , Patient's Chart, lab work & pertinent test results  History of Anesthesia Complications Negative for: history of anesthetic complications  Airway Mallampati: II  TM Distance: >3 FB Neck ROM: Full    Dental  (+) Dental Advisory Given, Teeth Intact   Pulmonary neg shortness of breath, neg sleep apnea, neg COPD, neg recent URI, Current Smoker,    breath sounds clear to auscultation       Cardiovascular hypertension, Pt. on medications + CAD, + Past MI, + Cardiac Stents and + Peripheral Vascular Disease   Rhythm:Regular     Neuro/Psych negative neurological ROS  negative psych ROS   GI/Hepatic negative GI ROS, Neg liver ROS,   Endo/Other  diabetes, Type 2, Oral Hypoglycemic AgentsHypothyroidism   Renal/GU negative Renal ROS     Musculoskeletal  (+) Arthritis ,   Abdominal   Peds  Hematology   Anesthesia Other Findings   Reproductive/Obstetrics                             Anesthesia Physical Anesthesia Plan  ASA: III  Anesthesia Plan: General   Post-op Pain Management:    Induction: Intravenous  Airway Management Planned: Oral ETT  Additional Equipment: Arterial line  Intra-op Plan:   Post-operative Plan: Extubation in OR  Informed Consent: I have reviewed the patients History and Physical, chart, labs and discussed the procedure including the risks, benefits and alternatives for the proposed anesthesia with the patient or authorized representative who has indicated his/her understanding and acceptance.   Dental advisory given  Plan Discussed with: CRNA and Surgeon  Anesthesia Plan Comments:         Anesthesia Quick Evaluation

## 2016-07-23 NOTE — Transfer of Care (Signed)
Immediate Anesthesia Transfer of Care Note  Patient: Olivia [name]  Procedure(s) Performed: Procedure(s): RIGHT CAROTID ENDARTERECTOMY (Right) PATCH ANGIOPLASTY USING XENOSURE VASCULAR PATCH (Right)  Patient Location: PACU  Anesthesia Type:General  Level of Consciousness: awake, alert , oriented and patient cooperative  Airway & Oxygen Therapy: Patient Spontanous Breathing and Patient connected to nasal cannula oxygen  Post-op Assessment: Report given to RN, Post -op Vital signs reviewed and stable, Patient moving all extremities X 4 and Patient able to stick tongue midline  Post vital signs: Reviewed and stable  Last Vitals:  Vitals:   07/23/16 0754  BP: 135/83  Pulse: 74  Resp: 20  Temp: 36.7 C    Last Pain:  Vitals:   07/23/16 0754  TempSrc: Oral         Complications: No apparent anesthesia complications

## 2016-07-23 NOTE — Interval H&P Note (Signed)
History and Physical Interval Note:  07/23/2016 8:00 AM  Olivia [name]  has presented today for surgery, with the diagnosis of Right carotid artery stenosis I65.21  The various methods of treatment have been discussed with the patient and family. After consideration of risks, benefits and other options for treatment, the patient has consented to  Procedure(s): ENDARTERECTOMY CAROTID (Right) as a surgical intervention .  The patient's history has been reviewed, patient examined, no change in status, stable for surgery.  I have reviewed the patient's chart and labs.  Questions were answered to the patient's satisfaction.     Durene Cal

## 2016-07-23 NOTE — H&P (View-Only) (Signed)
[facility] [place]  Patient name: Olivia [name] MRN: [id number] DOB: 1960/06/22 Sex: female   REFERRING PROVIDER:    Dr. Allyson Sabal   REASON FOR CONSULT:    Carotid stenosis Aortic occlusion  HISTORY OF PRESENT ILLNESS:   Olivia [name] is a 56 y.o. female, who is Referred today for evaluation of vascular insufficiency.  The patient states that she began having difficulty and March 2017.  She states that she gets cramping in her buttock region as well as the loss of feeling and sensation in her legs.  She has claudication like symptoms and 50 feet.  Her right leg bothers her more than the left.  She also complains of some numbness in her right arm.  She denies any neurologic symptoms such as loss of function of extremity, amaurosis fugax, or dysphagia.  The patient has a history of a STEMI and is status post PCI in 2012.  She has a significant family history for cardiovascular disease.  She is a current smoker, smoking 1-1/2 packs per day.  She suffers from diabetes which is not well controlled.  Her A1c has been greater than 9 in the past.  She is on a statin for hypercholesterolemia and an ACE inhibitor for hypertension.  PAST MEDICAL HISTORY    Past Medical History:  Diagnosis Date  . Arthritis    back  . 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 Coronary artery disease   . Diabetes mellitus without complication (HCC)   . Hypertension   . Myocardial infarction 2012     FAMILY HISTORY   Family History  Problem Relation Age of Onset  . Diabetes Mother   . Diabetes Father   . Diabetes Brother   . Diabetes Brother     SOCIAL HISTORY:   Social History   Social History  . Marital status: Married    Spouse name: N/A  . Number of children: N/A  . Years of education: N/A   Occupational History  . Not on file.   Social History Main Topics  .  Smoking status: Current Every Day Smoker    Packs/day: 1.00    Years: 30.00    Types: Cigarettes    Start date: 01/03/1976  . Smokeless tobacco: Never Used  . Alcohol use No  . Drug use: No  . Sexual activity: Not Currently    Birth control/ protection: Surgical   Other Topics Concern  . Not on file   Social History Narrative  . No narrative on file    ALLERGIES:    No Known Allergies  CURRENT MEDICATIONS:    Current Outpatient Prescriptions  Medication Sig Dispense Refill  . aspirin EC 81 MG tablet Take 81 mg by mouth daily.    . cyclobenzaprine (FLEXERIL) 10 MG tablet Take 1 tablet (10 mg total) by mouth 3 (three) times daily as needed for muscle spasms. 60 tablet 0  . gabapentin (NEURONTIN) 400 MG capsule Take 400 mg by mouth 3 (three) times daily.    Marland Kitchen glipiZIDE (GLUCOTROL) 10 MG tablet Take 10 mg by mouth daily before breakfast.    . levothyroxine (SYNTHROID, LEVOTHROID) 25 MCG tablet Take 25 mcg by mouth daily before breakfast.    . lisinopril (PRINIVIL,ZESTRIL) 5 MG tablet Take 1 tablet (5 mg total) by mouth daily.    . metFORMIN (GLUCOPHAGE) 1000 MG tablet Take 1,000 mg by mouth 2 (two) times  daily with a meal.    . metoprolol (LOPRESSOR) 50 MG tablet TAKE ONE TABLET BY MOUTH TWICE DAILY 180 tablet 3  . nitroGLYCERIN (NITROSTAT) 0.4 MG SL tablet Place 1 tablet (0.4 mg total) under the tongue every 5 (five) minutes as needed for chest pain. 25 tablet 3  . pravastatin (PRAVACHOL) 40 MG tablet Take 40 mg by mouth daily.    . ranolazine (RANEXA) 500 MG 12 hr tablet Take 1 tablet (500 mg total) by mouth 2 (two) times daily. 60 tablet 6  . sitaGLIPtin (JANUVIA) 100 MG tablet Take 100 mg by mouth daily.    . traMADol (ULTRAM) 50 MG tablet Take 1 tablet (50 mg total) by mouth every 6 (six) hours as needed. 60 tablet 0  . vitamin E 400 UNIT capsule Take 400 Units by mouth daily.     No current facility-administered medications for this visit.     REVIEW OF SYSTEMS:   [X]   denotes positive finding, [ ]  denotes negative finding Cardiac  Comments:  Chest pain or chest pressure:    Shortness of breath upon exertion:    Short of breath when lying flat:    Irregular heart rhythm:        Vascular    Pain in calf, thigh, or hip brought on by ambulation: x   Pain in feet at night that wakes you up from your sleep:     Blood clot in your veins:    Leg swelling:         Pulmonary    Oxygen at home:    Productive cough:     Wheezing:         Neurologic    Sudden weakness in arms or legs:     Sudden numbness in arms or legs:     Sudden onset of difficulty speaking or slurred speech:    Temporary loss of vision in one eye:     Problems with dizziness:         Gastrointestinal    Blood in stool:      Vomited blood:         Genitourinary    Burning when urinating:     Blood in urine:        Psychiatric    Major depression:         Hematologic    Bleeding problems:    Problems with blood clotting too easily:        Skin    Rashes or ulcers:        Constitutional    Fever or chills:     PHYSICAL EXAM:   Vitals:   07/19/16 0835 07/19/16 0836  BP: 106/82 (!) 141/84  Pulse: 72   Resp: 18   Temp: 97 F (36.1 C)   TempSrc: Oral   SpO2: 97%   Weight: 180 lb (81.6 kg)   Height: 5\' 3"  (1.6 m)     GENERAL: The patient is a well-nourished female, in no acute distress. The vital signs are documented above. CARDIAC: There is a regular rate and rhythm.  VASCULAR: no palpable pulse in right radial or brachial.  Needle and femoral pulses are not palpable.  No carotid bruits. PULMONARY: Nonlabored respirations ABDOMEN: Soft and non-tender with normal pitched bowel sounds.  MUSCULOSKELETAL: There are no major deformities or cyanosis. NEUROLOGIC: No focal weakness or paresthesias are detected. SKIN: There are no ulcers or rashes noted. PSYCHIATRIC: The patient has a normal affect.  STUDIES:  Carotid duplex: Occluded left carotid, greater than  80% right carotid CT angiogram neck: I have reviewed these images which showed an occluded left carotid artery with a 75% right carotid stenosis with the bifurcation the mid neck.  No innominate or right subclavian stenosis was identified CT angiogram abdomen and pelvis: Occluded infrarenal aorta with reconstitution of the external iliac artery bilaterally.   ASSESSMENT and PLAN   Carotid stenosis: I have discussed proceeding with a right carotid endarterectomy.  I discussed the risks and benefits of the operation with the patient including the risk of stroke, nerve injury, and cardiopulmonary complications.  All of her questions were answered.  This will be scheduled for Friday, March 2  Aortic occlusion: I discussed that we would proceed with aortobifemoral bypass, once she has recovered from her carotid endarterectomy.  She would like to get both of these done as soon as possible.   Durene Cal, MD [facility] [phone] Pager [phone]

## 2016-07-24 ENCOUNTER — Encounter (HOSPITAL_COMMUNITY): Payer: Self-pay

## 2016-07-24 LAB — BASIC METABOLIC PANEL
Anion gap: 11 (ref 5–15)
BUN: 8 mg/dL (ref 6–20)
CHLORIDE: 103 mmol/L (ref 101–111)
CO2: 23 mmol/L (ref 22–32)
CREATININE: 0.67 mg/dL (ref 0.44–1.00)
Calcium: 8.6 mg/dL — ABNORMAL LOW (ref 8.9–10.3)
GFR calc non Af Amer: >60 mL/min (ref >60)
Glucose, Bld: 136 mg/dL — ABNORMAL HIGH (ref 65–99)
POTASSIUM: 3.8 mmol/L (ref 3.5–5.1)
Sodium: 137 mmol/L (ref 135–145)

## 2016-07-24 LAB — CBC
HEMATOCRIT: 42 % (ref 36.0–46.0)
HEMOGLOBIN: 14.1 g/dL (ref 12.0–15.0)
MCH: 30 pg (ref 26.0–34.0)
MCHC: 33.6 g/dL (ref 30.0–36.0)
MCV: 89.4 fL (ref 78.0–100.0)
Platelets: 224 10*3/uL (ref 150–400)
RBC: 4.7 MIL/uL (ref 3.87–5.11)
RDW: 14.1 % (ref 11.5–15.5)
WBC: 11.3 10*3/uL — ABNORMAL HIGH (ref 4.0–10.5)

## 2016-07-24 LAB — GLUCOSE, CAPILLARY: Glucose-Capillary: 143 mg/dL — ABNORMAL HIGH (ref 65–99)

## 2016-07-24 NOTE — Plan of Care (Signed)
Problem: Activity: Goal: Ability to tolerate increased activity will improve Outcome: Progressing Pt safely ambulated to the bathroom with standby assist after setup.

## 2016-07-24 NOTE — Anesthesia Postprocedure Evaluation (Addendum)
Anesthesia Post Note  Patient: SONDOS PIO  Procedure(s) Performed: Procedure(s) (LRB): RIGHT CAROTID ENDARTERECTOMY (Right) PATCH ANGIOPLASTY USING XENOSURE VASCULAR PATCH (Right)  Patient location during evaluation: PACU Anesthesia Type: General Level of consciousness: awake and alert Pain management: pain level controlled Vital Signs Assessment: post-procedure vital signs reviewed and stable Respiratory status: spontaneous breathing, nonlabored ventilation, respiratory function stable and patient connected to nasal cannula oxygen Cardiovascular status: blood pressure returned to baseline and stable Postop Assessment: no signs of nausea or vomiting Anesthetic complications: no       Last Vitals:  Vitals:   07/24/16 0725 07/24/16 0800  BP: 123/70 (!) 131/59  Pulse: 85   Resp:  (!) 4  Temp:  36.8 C    Last Pain:  Vitals:   07/24/16 0800  TempSrc: Oral  PainSc:                  Lyonel Morejon

## 2016-07-24 NOTE — Plan of Care (Signed)
Problem: Bowel/Gastric: Goal: Gastrointestinal status for postoperative course will improve Outcome: Progressing Pt was advanced to full liquid diet and is tolerating sips of water and sprite.

## 2016-07-24 NOTE — Progress Notes (Signed)
Pt provided with printouts of smoking cessation educational information obtained from [employer].

## 2016-07-24 NOTE — Progress Notes (Signed)
Blood drawn from pt A-line then removed with no complications.  Dressing applied and pt educated on care.  Will continue to monitor.

## 2016-07-24 NOTE — Progress Notes (Addendum)
[facility] [place]  Subjective  - Doing well, ambulated, voided and tolerating PO's well.   Objective 130/72 85 98.2 F (36.8 C) (Oral) 15 94%  Intake/Output Summary (Last 24 hours) at 07/24/16 0711 Last data filed at 07/24/16 0200  Gross per 24 hour  Intake          2083.33 ml  Output              202 ml  Net          1881.33 ml    No tongue deviation, smile is symmetric Grip 5/5  Right neck incision healing well without hematoma, minimal edema and ecchymosis.  Assessment/Planning: POD # 1 right CEA  Dispositions stable for discharge F/U with Dr. Myra Gianotti in 2 weeks  Thomasena Edis, [facility] 07/24/2016 7:11 AM -- Neuro intact incision looks good swallow ok ambulating D/c home  Fabienne Bruns, MD [facility] [place] Office: [phone] Pager: [phone]  Laboratory Lab Results:  Recent Labs  07/22/16 1720 07/24/16 0500  WBC 8.8 11.3*  HGB 17.0* 14.1  HCT 49.7* 42.0  PLT 244 224   BMET  Recent Labs  07/22/16 1720 07/24/16 0500  NA 138 137  K 4.1 3.8  CL 104 103  CO2 21* 23  GLUCOSE 109* 136*  BUN 7 8  CREATININE 0.62 0.67  CALCIUM 9.2 8.6*    COAG Lab Results  Component Value Date   INR 0.88 07/22/2016   INR 0.94 02/25/2011   INR 0.99 08/21/2009   No results found for: PTT

## 2016-07-26 ENCOUNTER — Encounter (HOSPITAL_COMMUNITY): Payer: Self-pay | Admitting: Surgery

## 2016-07-26 ENCOUNTER — Telehealth: Payer: Self-pay | Admitting: Surgery

## 2016-07-26 NOTE — Telephone Encounter (Signed)
-----   Message from Sharee Pimple, RN sent at 07/26/2016  9:48 AM EST ----- Regarding: 2 weeks   ----- Message ----- From: Lars Mage, PA-C Sent: 07/24/2016   7:14 AM To: Vvs Charge Pool  F/U in 2 weeks with Dr. Myra Gianotti post right CEA

## 2016-07-26 NOTE — Telephone Encounter (Signed)
Spoke to pt on home # for 3/19 appt, mailed lttr

## 2016-07-28 ENCOUNTER — Telehealth: Payer: Self-pay | Admitting: Cardiovascular Disease

## 2016-07-28 NOTE — Telephone Encounter (Signed)
ECHOCARDIOGRAM COMPLETE  Order: [id number]  Status:  Final result Visible to patient:  No (Not Released) Dx:  Pre-operative cardiovascular examination  Notes Recorded by Evans Lance on 07/28/2016 at 4:13 PM EST lmtcb for results ------  Notes Recorded by Runell Gess, MD on 07/24/2016 at 4:14 PM EST Essentially normal study. Repeat when clinically indicated.

## 2016-07-28 NOTE — Discharge Summary (Signed)
[facility] Discharge Summary   Patient ID:  Olivia [name] MRN: [id number] DOB/AGE: April 04, 1961 56 y.o.  Admit date: 07/23/2016 Discharge date: 07/24/2016 Date of Surgery: 07/23/2016 Surgeon: Surgeon(s): Nada Libman, MD  Admission Diagnosis: Right carotid artery stenosis I65.21  Discharge Diagnoses:  Right carotid artery stenosis I65.21  Secondary Diagnoses: Past Medical History:  Diagnosis Date  . Arthritis    back  . 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 Coronary artery disease   . Diabetes mellitus without complication (HCC)    Type II  . Hypertension   . Hypothyroidism   . Myocardial infarction 2012    Procedure(s): RIGHT CAROTID ENDARTERECTOMY PATCH ANGIOPLASTY USING XENOSURE VASCULAR PATCH  Discharged Condition: good  HPI: Olivia [name] is a 56 y.o. female, who is Referred today for evaluation of vascular insufficiency.  The patient states that she began having difficulty and March 2017.  She states that she gets cramping in her buttock region as well as the loss of feeling and sensation in her legs.  She has claudication like symptoms and 50 feet.  Her right leg bothers her more than the left.  She also complains of some numbness in her right arm.  She denies any neurologic symptoms such as loss of function of extremity, amaurosis fugax, or dysphagia.  The patient has a history of a STEMI and is status post PCI in 2012.  She has a significant family history for cardiovascular disease.  She is a current smoker, smoking 1-1/2 packs per day.  She suffers from diabetes which is not well controlled.  Her A1c has been greater than 9 in the past.  She is on a statin for hypercholesterolemia and an ACE inhibitor for hypertension.   Hospital Course:  SHELDA [name] is a 56 y.o. female is S/P  Procedure(s): RIGHT CAROTID ENDARTERECTOMY PATCH ANGIOPLASTY USING XENOSURE VASCULAR  PATCH Neuro intact incision looks good swallow ok ambulating D/c home   Significant Diagnostic Studies: CBC Lab Results  Component Value Date   WBC 11.3 (H) 07/24/2016   HGB 14.1 07/24/2016   HCT 42.0 07/24/2016   MCV 89.4 07/24/2016   PLT 224 07/24/2016    BMET    Component Value Date/Time   NA 137 07/24/2016 0500   NA 137 07/13/2016 1526   K 3.8 07/24/2016 0500   CL 103 07/24/2016 0500   CO2 23 07/24/2016 0500   GLUCOSE 136 (H) 07/24/2016 0500   BUN 8 07/24/2016 0500   BUN 20 07/13/2016 1526   CREATININE 0.67 07/24/2016 0500   CREATININE 0.73 07/07/2016 1127   CALCIUM 8.6 (L) 07/24/2016 0500   GFRNONAA >60 07/24/2016 0500   GFRNONAA >89 07/07/2016 1127   GFRAA >60 07/24/2016 0500   GFRAA >89 07/07/2016 1127   COAG Lab Results  Component Value Date   INR 0.88 07/22/2016   INR 0.94 02/25/2011   INR 0.99 08/21/2009     Disposition:  Discharge to :Home Discharge Instructions    Ambulatory referral to Smoking Cessation Program    Complete by:  As directed    CAROTID Sugery: Call MD for difficulty swallowing or speaking; weakness in arms or legs that is a new symtom; severe headache.  If you have increased swelling in the neck and/or  are having difficulty breathing, CALL 911    Complete by:  As directed    Call MD for:  redness, tenderness, or signs  of infection (pain, swelling, bleeding, redness, odor or green/yellow discharge around incision site)    Complete by:  As directed    Call MD for:  severe or increased pain, loss or decreased feeling  in affected limb(s)    Complete by:  As directed    Call MD for:  temperature >100.5    Complete by:  As directed    Discharge wound care:    Complete by:  As directed    Shower daily with soap and water starting 07/25/16   Driving Restrictions    Complete by:  As directed    No driving for 2 weeks   Lifting restrictions    Complete by:  As directed    No lifting for 2 weeks   Resume previous diet    Complete by:   As directed      Allergies as of 07/24/2016      Reactions   No Known Allergies       Medication List    TAKE these medications   aspirin EC 81 MG tablet Take 81 mg by mouth daily.   gabapentin 400 MG capsule Commonly known as:  NEURONTIN Take 400 mg by mouth 3 (three) times daily.   glipiZIDE 10 MG tablet Commonly known as:  GLUCOTROL Take 10 mg by mouth daily before breakfast.   JARDIANCE 25 MG Tabs tablet Generic drug:  empagliflozin Take 1 tablet by mouth daily.   levothyroxine 25 MCG tablet Commonly known as:  SYNTHROID, LEVOTHROID Take 25 mcg by mouth daily before breakfast.   lisinopril 5 MG tablet Commonly known as:  PRINIVIL,ZESTRIL Take 1 tablet (5 mg total) by mouth daily.   metFORMIN 1000 MG tablet Commonly known as:  GLUCOPHAGE Take 1,000 mg by mouth 2 (two) times daily with a meal.   metoprolol 50 MG tablet Commonly known as:  LOPRESSOR TAKE ONE TABLET BY MOUTH TWICE DAILY   nitroGLYCERIN 0.4 MG SL tablet Commonly known as:  NITROSTAT Place 1 tablet (0.4 mg total) under the tongue every 5 (five) minutes as needed for chest pain.   oxyCODONE-acetaminophen 5-325 MG tablet Commonly known as:  ROXICET Take 1 tablet by mouth every 6 (six) hours as needed for severe pain.   pravastatin 40 MG tablet Commonly known as:  PRAVACHOL Take 40 mg by mouth daily.   vitamin E 400 UNIT capsule Take 400 Units by mouth daily.      Verbal and written Discharge instructions given to the patient. Wound care per Discharge AVS Follow-up Information    Durene Cal, MD Follow up in 2 week(s).   Specialties:  Vascular Surgery, Cardiology Why:  office will call Contact information: [address] [place] [address] [phone]           Signed: [facility] 07/28/2016, 4:22 PM --- For VQI Registry use --- Instructions: Press F2 to tab through selections.  Delete question if not applicable.   Modified Rankin score at D/C (0-6): Rankin  Score=0  IV medication needed for:  1. Hypertension: No 2. Hypotension: No  Post-op Complications: No  1. Post-op CVA or TIA: No  If yes: Event classification (right eye, left eye, right cortical, left cortical, verterobasilar, other):   If yes: Timing of event (intra-op, <6 hrs post-op, >=6 hrs post-op, unknown):   2. CN injury: No  If yes: CN  injuried   3. Myocardial infarction: No  If yes: Dx by (EKG or clinical, Troponin):   4.  CHF: No  5.  Dysrhythmia (new): No  6. Wound infection: No  7. Reperfusion symptoms: No  8. Return to OR: No  If yes: return to OR for (bleeding, neurologic, other CEA incision, other):   Discharge medications: Statin use:  Yes ASA use:  Yes Beta blocker use:  Yes ACE-Inhibitor use:  Yes P2Y12 Antagonist use: [x ] None, [ ]  Plavix, [ ]  Plasugrel, [ ]  Ticlopinine, [ ]  Ticagrelor, [ ]  Other, [ ]  No for medical reason, [ ]  Non-compliant, [ ]  Not-indicated Anti-coagulant use:  [x ] None, [ ]  Warfarin, [ ]  Rivaroxaban, [ ]  Dabigatran, [ ]  Other, [ ]  No for medical reason, [ ]  Non-compliant, [ ]  Not-indicated

## 2016-07-30 ENCOUNTER — Encounter: Payer: Self-pay | Admitting: Surgery

## 2016-08-06 ENCOUNTER — Encounter: Payer: Self-pay | Admitting: Surgery

## 2016-08-09 ENCOUNTER — Ambulatory Visit (INDEPENDENT_AMBULATORY_CARE_PROVIDER_SITE_OTHER): Payer: BLUE CROSS/BLUE SHIELD | Admitting: Surgery

## 2016-08-09 ENCOUNTER — Encounter: Payer: Self-pay | Admitting: Surgery

## 2016-08-09 ENCOUNTER — Other Ambulatory Visit: Payer: Self-pay

## 2016-08-09 VITALS — BP 120/82 | HR 103 | Temp 98.3°F | Resp 18 | Ht 63.0 in | Wt 179.9 lb

## 2016-08-09 DIAGNOSIS — Z0279 Encounter for issue of other medical certificate: Secondary | ICD-10-CM

## 2016-08-09 DIAGNOSIS — T82330A Leakage of aortic (bifurcation) graft (replacement), initial encounter: Secondary | ICD-10-CM

## 2016-08-09 DIAGNOSIS — I7410 Embolism and thrombosis of unspecified parts of aorta: Secondary | ICD-10-CM

## 2016-08-09 DIAGNOSIS — I700 Atherosclerosis of aorta: Secondary | ICD-10-CM

## 2016-08-09 DIAGNOSIS — IMO0001 Reserved for inherently not codable concepts without codable children: Secondary | ICD-10-CM

## 2016-08-09 NOTE — Addendum Note (Signed)
Addended by: Burton Apley A on: 08/09/2016 09:19 AM   Modules accepted: Orders

## 2016-08-09 NOTE — Progress Notes (Signed)
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 female who returns today for 2 reasons.  The first is that she is status post right carotid endarterectomy on 07/23/2016.  Second his preoperative evaluation for aortobifemoral bypass graft.  Thank you vascular workup she was found to have an occluded left carotid artery and a greater than 80% right carotid stenosis.  She was asymptomatic.  On 07/23/2016 she underwent right carotid endarterectomy for asymptomatic stenosis.  Intraoperative findings included an 80% stenosis with a very high lesion.  Her postoperative course was uncomplicated.  She is discharged on the following day.  She has no complaints today.  She denies any numbness or weakness.  She has no trouble eating.  The patient also suffers from lifestyle limiting claudication.  She gets buttock cramping and loss of feeling in her legs when she walks less than 50 feet.  Her right leg is more severe than the left.  She does not have any ulcers.  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  The patient has a history of a STEMI and is status post PCI in 2012.  She has a significant family history for cardiovascular disease.  She is a current smoker, smoking 1-1/2 packs per day.  She suffers from diabetes which is not well controlled.  Her A1c has been greater than 9 in the past.  She is on a statin for hypercholesterolemia and an ACE inhibitor for hypertension.     PAST MEDICAL HISTORY:   Past Medical History:  Diagnosis Date  . Arthritis    back  . 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 Coronary artery disease   . Diabetes mellitus without complication  (HCC)    Type II  . Hypertension   . Hypothyroidism   . Myocardial infarction 2012     FAMILY HISTORY:   Family History  Problem Relation Age of Onset  . Diabetes Mother   . Diabetes Father   . Diabetes Brother   . Diabetes Brother     SOCIAL HISTORY:   Social History  Substance Use Topics  . Smoking status: Current Every Day Smoker    Packs/day: 1.50    Years: 40.00    Types: Cigarettes    Start date: 01/03/1976  . Smokeless tobacco: Never Used  . Alcohol use No     ALLERGIES:   Allergies  Allergen Reactions  . No Known Allergies      CURRENT MEDICATIONS:   Current Outpatient Prescriptions  Medication Sig Dispense Refill  . aspirin EC 81 MG tablet Take 81 mg by mouth daily.    Marland Kitchen gabapentin (NEURONTIN) 400 MG capsule Take 400 mg by mouth 3 (three) times daily.    Marland Kitchen glipiZIDE (GLUCOTROL) 10 MG tablet Take 10 mg by mouth daily before breakfast.    . JARDIANCE 25 MG TABS tablet Take 1 tablet by mouth daily.    Marland Kitchen levothyroxine (SYNTHROID, LEVOTHROID) 25 MCG tablet Take 25 mcg by mouth daily before breakfast.    . lisinopril (PRINIVIL,ZESTRIL) 5 MG tablet Take 1 tablet (5 mg total) by mouth daily.    . metFORMIN (GLUCOPHAGE) 1000 MG tablet Take 1,000 mg  by mouth 2 (two) times daily with a meal.    . metoprolol (LOPRESSOR) 50 MG tablet TAKE ONE TABLET BY MOUTH TWICE DAILY 180 tablet 3  . nitroGLYCERIN (NITROSTAT) 0.4 MG SL tablet Place 1 tablet (0.4 mg total) under the tongue every 5 (five) minutes as needed for chest pain. 25 tablet 3  . oxyCODONE-acetaminophen (ROXICET) 5-325 MG tablet Take 1 tablet by mouth every 6 (six) hours as needed for severe pain. 8 tablet 0  . pravastatin (PRAVACHOL) 40 MG tablet Take 40 mg by mouth daily.    . vitamin E 400 UNIT capsule Take 400 Units by mouth daily.     No current facility-administered medications for this visit.     REVIEW OF SYSTEMS:   [X]  denotes positive finding, [ ]  denotes negative finding Cardiac  Comments:    Chest pain or chest pressure:    Shortness of breath upon exertion:    Short of breath when lying flat:    Irregular heart rhythm:        Vascular    Pain in calf, thigh, or hip brought on by ambulation: x   Pain in feet at night that wakes you up from your sleep:     Blood clot in your veins:    Leg swelling:         Pulmonary    Oxygen at home:    Productive cough:     Wheezing:         Neurologic    Sudden weakness in arms or legs:  x   Sudden numbness in arms or legs:  x   Sudden onset of difficulty speaking or slurred speech:    Temporary loss of vision in one eye:     Problems with dizziness:         Gastrointestinal    Blood in stool:     Vomited blood:         Genitourinary    Burning when urinating:     Blood in urine:        Psychiatric    Major depression:         Hematologic    Bleeding problems:    Problems with blood clotting too easily:        Skin    Rashes or ulcers:        Constitutional    Fever or chills:      PHYSICAL EXAM:   Vitals:   08/09/16 0832  BP: 120/82  Pulse: (!) 103  Resp: 18  Temp: 98.3 F (36.8 C)  TempSrc: Oral  SpO2: 97%  Weight: 179 lb 14.4 oz (81.6 kg)  Height: 5\' 3"  (1.6 m)    GENERAL: The patient is a well-nourished female, in no acute distress. The vital signs are documented above. CARDIAC: There is a regular rate and rhythm.  VASCULAR: Femoral and pedal pulses are not palpable.  Her right carotid incision is healing nicely. PULMONARY: Non-labored respirations ABDOMEN: Soft and non-tender with normal pitched bowel sounds.  MUSCULOSKELETAL: There are no major deformities or cyanosis. NEUROLOGIC: No focal weakness or paresthesias are detected. SKIN: There are no ulcers or rashes noted. PSYCHIATRIC: The patient has a normal affect.  STUDIES:   I again reviewed her CT angiogram of the abdomen and pelvis.  This shows aortic occlusion with reconstitution of bilateral common external iliac arteries.  The study  stops in the mid thigh.  MEDICAL ISSUES:   #1: Carotid stenosis: The patient is status post  right carotid endarterectomy.  She has had no postoperative issues.  She will be scheduled for follow-up ultrasound in 6 months.  Aortic occlusion: We discussed proceeding with aortobifemoral bypass graft.  I discussed the risks and benefits of the operation including the risk of stroke, intestinal ischemia, renal failure, bleeding, death, wound issues.  All of her questions were answered.  She is eager to get this taken care of because of her leg symptoms.  Her only abdominal surgery has been a cholecystectomy.  I have scheduled her operation for Wednesday, March 28.  I am going to repeat her CT angiogram so that I can have a preoperative idea of her runoff vessels since her initial CT scan stopped at the knee.    Durene Cal, MD [facility] of [facility] [phone] Pager [phone]

## 2016-08-17 ENCOUNTER — Encounter (HOSPITAL_COMMUNITY): Payer: Self-pay

## 2016-08-17 ENCOUNTER — Other Ambulatory Visit (HOSPITAL_COMMUNITY): Payer: Self-pay | Admitting: *Deleted

## 2016-08-17 ENCOUNTER — Encounter (HOSPITAL_COMMUNITY)
Admission: RE | Admit: 2016-08-17 | Discharge: 2016-08-17 | Disposition: A | Discharge status: Home / Self Care | Payer: BLUE CROSS/BLUE SHIELD | Source: Ambulatory Visit | Attending: Surgery | Admitting: Surgery

## 2016-08-17 HISTORY — DX: Nausea with vomiting, unspecified: R11.2

## 2016-08-17 HISTORY — DX: Other specified postprocedural states: Z98.890

## 2016-08-17 LAB — URINALYSIS, ROUTINE W REFLEX MICROSCOPIC
BILIRUBIN URINE: NEGATIVE
Glucose, UA: >=500 mg/dL — AB
Hgb urine dipstick: NEGATIVE
KETONES UR: NEGATIVE mg/dL
Leukocytes, UA: NEGATIVE
Nitrite: NEGATIVE
PH: 5 (ref 5.0–8.0)
PROTEIN: NEGATIVE mg/dL
Specific Gravity, Urine: 1.032 — ABNORMAL HIGH (ref 1.005–1.030)

## 2016-08-17 LAB — COMPREHENSIVE METABOLIC PANEL
ALT: 18 U/L (ref 14–54)
ANION GAP: 14 (ref 5–15)
AST: 20 U/L (ref 15–41)
Albumin: 3.7 g/dL (ref 3.5–5.0)
Alkaline Phosphatase: 93 U/L (ref 38–126)
BUN: 12 mg/dL (ref 6–20)
CALCIUM: 9.4 mg/dL (ref 8.9–10.3)
CO2: 21 mmol/L — AB (ref 22–32)
CREATININE: 0.54 mg/dL (ref 0.44–1.00)
Chloride: 104 mmol/L (ref 101–111)
Glucose, Bld: 174 mg/dL — ABNORMAL HIGH (ref 65–99)
Potassium: 4.1 mmol/L (ref 3.5–5.1)
Sodium: 139 mmol/L (ref 135–145)
TOTAL PROTEIN: 6.7 g/dL (ref 6.5–8.1)
Total Bilirubin: 0.2 mg/dL — ABNORMAL LOW (ref 0.3–1.2)

## 2016-08-17 LAB — APTT: aPTT: 26 seconds (ref 24–36)

## 2016-08-17 LAB — CBC
HCT: 49.1 % — ABNORMAL HIGH (ref 36.0–46.0)
HEMOGLOBIN: 16.7 g/dL — AB (ref 12.0–15.0)
MCH: 30.4 pg (ref 26.0–34.0)
MCHC: 34 g/dL (ref 30.0–36.0)
MCV: 89.4 fL (ref 78.0–100.0)
Platelets: 248 10*3/uL (ref 150–400)
RBC: 5.49 MIL/uL — AB (ref 3.87–5.11)
RDW: 14.4 % (ref 11.5–15.5)
WBC: 9.2 10*3/uL (ref 4.0–10.5)

## 2016-08-17 LAB — GLUCOSE, CAPILLARY: Glucose-Capillary: 166 mg/dL — ABNORMAL HIGH (ref 65–99)

## 2016-08-17 LAB — PROTIME-INR
INR: 0.85
PROTHROMBIN TIME: 11.5 s (ref 11.4–15.2)

## 2016-08-17 NOTE — Pre-Procedure Instructions (Signed)
Olivia [name]  08/17/2016      [facility] [address] [place] - [name] [address] Phone: [phone] Fax: [phone]  [facility] [address], [place] - [place] [address] [place] [address] Phone: [phone] Fax: [phone]    Your procedure is scheduled on 08-18-2016   Wednesday .  Report to [facility] Admitting at  6:00A.M.   Call this number if you have problems the morning of surgery:  [phone]   Remember:  Do not eat food or drink liquids after midnight.   Take these medicines the morning of surgery with A SIP OF WATER Gabapentin(Neurontin),levothyroxine(Synthroid),  STOP ASPIRIN,ANTIINFLAMATORIES (IBUPROFEN,ALEVE,MOTRIN,ADVIL,GOODY'S POWDERS),HERBAL SUPPLEMENTS,FISH OIL,AND VITAMINS 5-7 DAYS PRIOR TO SURGERY      How to Manage Your Diabetes Before and After Surgery  Why is it important to control my blood sugar before and after surgery? . Improving blood sugar levels before and after surgery helps healing and can limit problems. . A way of improving blood sugar control is eating a healthy diet by: o  Eating less sugar and carbohydrates o  Increasing activity/exercise o  Talking with your doctor about reaching your blood sugar goals . High blood sugars (greater than 180 mg/dL) can raise your risk of infections and slow your recovery, so you will need to focus on controlling your diabetes during the weeks before surgery. . Make sure that the doctor who takes care of your diabetes knows about your planned surgery including the date and location.  How do I manage my blood sugar before surgery? . Check your blood sugar at least 4 times a day, starting 2 days before surgery, to make sure that the level is not too high or low. o Check your blood sugar the morning of your surgery when you wake up and every 2 hours until you get to the Short Stay unit. . If your blood sugar  is less than 70 mg/dL, you will need to treat for low blood sugar: o Do not take insulin. o Treat a low blood sugar (less than 70 mg/dL) with  cup of clear juice (cranberry or apple), 4 glucose tablets, OR glucose gel. o Recheck blood sugar in 15 minutes after treatment (to make sure it is greater than 70 mg/dL). If your blood sugar is not greater than 70 mg/dL on recheck, call [phone] for further instructions. . Report your blood sugar to the short stay nurse when you get to Short Stay.  . If you are admitted to the hospital after surgery: o Your blood sugar will be checked by the staff and you will probably be given insulin after surgery (instead of oral diabetes medicines) to make sure you have good blood sugar levels. o The goal for blood sugar control after surgery is 80-180 mg/dL.              WHAT DO I DO ABOUT MY DIABETES MEDICATION?   Marland Kitchen Do not take oral diabetes medicines (pills) the morning of surgery.      . The day of surgery, do not take other diabetes injectables, including Byetta (exenatide), Bydureon (exenatide ER), Victoza (liraglutide), or Trulicity (dulaglutide).   Reviewed and Endorsed by [facility] Patient Education Committee, August 2015   Do not wear jewelry, make-up or nail polish.  Do not wear lotions, powders, or perfumes, or deoderant.  Do not shave 48 hours prior to surgery.  Men may shave face  and neck.  Do not bring valuables to the hospital.  [facility] is not responsible for any belongings or valuables.  Contacts, dentures or bridgework may not be worn into surgery.  Leave your suitcase in the car.  After surgery it may be brought to your room.  For patients admitted to the hospital, discharge time will be determined by your treatment team.  Patients discharged the day of surgery will not be allowed to drive home.   Special Instructions: [facility] - Preparing for Surgery  Before surgery, you can play an important role.  Because  skin is not sterile, your skin needs to be as free of germs as possible.  You can reduce the number of germs on you skin by washing with CHG (chlorahexidine gluconate) soap before surgery.  CHG is an antiseptic cleaner which kills germs and bonds with the skin to continue killing germs even after washing.  Please DO NOT use if you have an allergy to CHG or antibacterial soaps.  If your skin becomes reddened/irritated stop using the CHG and inform your nurse when you arrive at Short Stay.  Do not shave (including legs and underarms) for at least 48 hours prior to the first CHG shower.  You may shave your face.  Please follow these instructions carefully:   1.  Shower with CHG Soap the night before surgery and the   morning of Surgery.  2.  If you choose to wash your hair, wash your hair first as usual with your normal shampoo.  3.  After you shampoo, rinse your hair and body thoroughly to remove the  Shampoo.  4.  Use CHG as you would any other liquid soap.  You can apply chg directly  to the skin and wash gently with scrungie or a clean washcloth.  5.  Apply the CHG Soap to your body ONLY FROM THE NECK DOWN.   Do not use on open wounds or open sores.  Avoid contact with your eyes,  ears, mouth and genitals (private parts).  Wash genitals (private parts) with your normal soap.  6.  Wash thoroughly, paying special attention to the area where your surgery will be performed.  7.  Thoroughly rinse your body with warm water from the neck down.  8.  DO NOT shower/wash with your normal soap after using and rinsing o  the CHG Soap.  9.  Pat yourself dry with a clean towel.            10.  Wear clean pajamas.            11.  Place clean sheets on your bed the night of your first shower and do not sleep with pets.  Day of Surgery  Do not apply any lotions/deodorants the morning of surgery.  Please wear clean clothes to the hospital/surgery center.

## 2016-08-18 ENCOUNTER — Encounter (HOSPITAL_COMMUNITY): Payer: Self-pay | Admitting: *Deleted

## 2016-08-18 ENCOUNTER — Encounter (HOSPITAL_COMMUNITY)
Admission: RE | Disposition: A | Discharge status: Home / Self Care | Payer: Self-pay | Source: Ambulatory Visit | Attending: Surgery

## 2016-08-18 ENCOUNTER — Inpatient Hospital Stay (HOSPITAL_COMMUNITY): Payer: BLUE CROSS/BLUE SHIELD

## 2016-08-18 ENCOUNTER — Inpatient Hospital Stay (HOSPITAL_COMMUNITY): Payer: BLUE CROSS/BLUE SHIELD | Admitting: Certified Registered Nurse Anesthetist

## 2016-08-18 ENCOUNTER — Inpatient Hospital Stay (HOSPITAL_COMMUNITY)
Admission: RE | Admit: 2016-08-18 | Discharge: 2016-08-21 | DRG: 269 | Disposition: A | Discharge status: Home / Self Care | Payer: BLUE CROSS/BLUE SHIELD | Source: Ambulatory Visit | Attending: Surgery | Admitting: Surgery

## 2016-08-18 DIAGNOSIS — Z9049 Acquired absence of other specified parts of digestive tract: Secondary | ICD-10-CM | POA: Diagnosis not present

## 2016-08-18 DIAGNOSIS — F17210 Nicotine dependence, cigarettes, uncomplicated: Secondary | ICD-10-CM | POA: Diagnosis present

## 2016-08-18 DIAGNOSIS — Z7984 Long term (current) use of oral hypoglycemic drugs: Secondary | ICD-10-CM | POA: Diagnosis not present

## 2016-08-18 DIAGNOSIS — Z8679 Personal history of other diseases of the circulatory system: Secondary | ICD-10-CM

## 2016-08-18 DIAGNOSIS — Z95828 Presence of other vascular implants and grafts: Secondary | ICD-10-CM

## 2016-08-18 DIAGNOSIS — Z8249 Family history of ischemic heart disease and other diseases of the circulatory system: Secondary | ICD-10-CM | POA: Diagnosis not present

## 2016-08-18 DIAGNOSIS — Z9861 Coronary angioplasty status: Secondary | ICD-10-CM

## 2016-08-18 DIAGNOSIS — I70213 Atherosclerosis of native arteries of extremities with intermittent claudication, bilateral legs: Secondary | ICD-10-CM | POA: Diagnosis present

## 2016-08-18 DIAGNOSIS — Z7982 Long term (current) use of aspirin: Secondary | ICD-10-CM

## 2016-08-18 DIAGNOSIS — R109 Unspecified abdominal pain: Secondary | ICD-10-CM | POA: Diagnosis not present

## 2016-08-18 DIAGNOSIS — E1151 Type 2 diabetes mellitus with diabetic peripheral angiopathy without gangrene: Secondary | ICD-10-CM | POA: Diagnosis present

## 2016-08-18 DIAGNOSIS — R110 Nausea: Secondary | ICD-10-CM | POA: Diagnosis not present

## 2016-08-18 DIAGNOSIS — I7409 Other arterial embolism and thrombosis of abdominal aorta: Principal | ICD-10-CM | POA: Diagnosis present

## 2016-08-18 DIAGNOSIS — D62 Acute posthemorrhagic anemia: Secondary | ICD-10-CM | POA: Diagnosis not present

## 2016-08-18 DIAGNOSIS — E7800 Pure hypercholesterolemia, unspecified: Secondary | ICD-10-CM | POA: Diagnosis present

## 2016-08-18 DIAGNOSIS — E039 Hypothyroidism, unspecified: Secondary | ICD-10-CM | POA: Diagnosis present

## 2016-08-18 DIAGNOSIS — Z79899 Other long term (current) drug therapy: Secondary | ICD-10-CM | POA: Diagnosis not present

## 2016-08-18 DIAGNOSIS — I252 Old myocardial infarction: Secondary | ICD-10-CM

## 2016-08-18 DIAGNOSIS — Z833 Family history of diabetes mellitus: Secondary | ICD-10-CM

## 2016-08-18 DIAGNOSIS — I10 Essential (primary) hypertension: Secondary | ICD-10-CM | POA: Diagnosis present

## 2016-08-18 DIAGNOSIS — R451 Restlessness and agitation: Secondary | ICD-10-CM | POA: Diagnosis not present

## 2016-08-18 HISTORY — PX: AORTA - BILATERAL FEMORAL ARTERY BYPASS GRAFT: SHX1175

## 2016-08-18 LAB — BLOOD GAS, ARTERIAL
ACID-BASE DEFICIT: 1.9 mmol/L (ref 0.0–2.0)
Bicarbonate: 22.7 mmol/L (ref 20.0–28.0)
FIO2: 21
O2 Saturation: 96.1 %
PCO2 ART: 41 mmHg (ref 32.0–48.0)
PH ART: 7.361 (ref 7.350–7.450)
Patient temperature: 98.5
pO2, Arterial: 89.1 mmHg (ref 83.0–108.0)

## 2016-08-18 LAB — BASIC METABOLIC PANEL
ANION GAP: 11 (ref 5–15)
BUN: 12 mg/dL (ref 6–20)
CALCIUM: 8.3 mg/dL — AB (ref 8.9–10.3)
CO2: 23 mmol/L (ref 22–32)
CREATININE: 0.59 mg/dL (ref 0.44–1.00)
Chloride: 108 mmol/L (ref 101–111)
GLUCOSE: 147 mg/dL — AB (ref 65–99)
Potassium: 3.2 mmol/L — ABNORMAL LOW (ref 3.5–5.1)
Sodium: 142 mmol/L (ref 135–145)

## 2016-08-18 LAB — GLUCOSE, CAPILLARY
GLUCOSE-CAPILLARY: 231 mg/dL — AB (ref 65–99)
GLUCOSE-CAPILLARY: 108 mg/dL — AB (ref 65–99)
GLUCOSE-CAPILLARY: 181 mg/dL — AB (ref 65–99)
Glucose-Capillary: 141 mg/dL — ABNORMAL HIGH (ref 65–99)

## 2016-08-18 LAB — HEMOGLOBIN A1C
Hgb A1c MFr Bld: 10.2 % — ABNORMAL HIGH (ref 4.8–5.6)
Mean Plasma Glucose: 246 mg/dL

## 2016-08-18 LAB — MAGNESIUM: MAGNESIUM: 1.3 mg/dL — AB (ref 1.7–2.4)

## 2016-08-18 LAB — CBC
HCT: 41.1 % (ref 36.0–46.0)
Hemoglobin: 13.7 g/dL (ref 12.0–15.0)
MCH: 29.8 pg (ref 26.0–34.0)
MCHC: 33.3 g/dL (ref 30.0–36.0)
MCV: 89.3 fL (ref 78.0–100.0)
PLATELETS: 203 10*3/uL (ref 150–400)
RBC: 4.6 MIL/uL (ref 3.87–5.11)
RDW: 14.3 % (ref 11.5–15.5)
WBC: 18.2 10*3/uL — AB (ref 4.0–10.5)

## 2016-08-18 LAB — PROTIME-INR
INR: 1.1
Prothrombin Time: 14.3 seconds (ref 11.4–15.2)

## 2016-08-18 LAB — APTT: aPTT: 22 seconds — ABNORMAL LOW (ref 24–36)

## 2016-08-18 LAB — PREPARE RBC (CROSSMATCH)

## 2016-08-18 SURGERY — CREATION, BYPASS, ARTERIAL, AORTA TO FEMORAL, BILATERAL, USING GRAFT
Anesthesia: General | Site: Abdomen | Laterality: Bilateral

## 2016-08-18 MED ORDER — PROPOFOL 10 MG/ML IV BOLUS
INTRAVENOUS | Status: AC
  Filled 2016-08-18: qty 20

## 2016-08-18 MED ORDER — 0.9 % SODIUM CHLORIDE (POUR BTL) OPTIME
TOPICAL | Status: DC | PRN
  Administered 2016-08-18: 2000 mL

## 2016-08-18 MED ORDER — PROMETHAZINE HCL 25 MG/ML IJ SOLN: 6.2500 mg | INTRAMUSCULAR | Status: DC | PRN

## 2016-08-18 MED ORDER — SODIUM CHLORIDE 0.9 % IV SOLN
INTRAVENOUS | Status: DC
  Filled 2016-08-18: qty 2.5

## 2016-08-18 MED ORDER — INSULIN ASPART 100 UNIT/ML ~~LOC~~ SOLN
0.0000 [IU] | Freq: Three times a day (TID) | SUBCUTANEOUS | Status: DC
  Administered 2016-08-18: 3 [IU] via SUBCUTANEOUS
  Administered 2016-08-19: 2 [IU] via SUBCUTANEOUS
  Administered 2016-08-19: 8 [IU] via SUBCUTANEOUS
  Administered 2016-08-19: 3 [IU] via SUBCUTANEOUS
  Administered 2016-08-20 (×2): 2 [IU] via SUBCUTANEOUS
  Administered 2016-08-20 – 2016-08-21 (×3): 3 [IU] via SUBCUTANEOUS

## 2016-08-18 MED ORDER — POTASSIUM CHLORIDE CRYS ER 20 MEQ PO TBCR: 20.0000 meq | EXTENDED_RELEASE_TABLET | Freq: Once | ORAL | Status: DC | PRN

## 2016-08-18 MED ORDER — DEXTROSE 5 % IV SOLN
1.5000 g | INTRAVENOUS | Status: AC
  Administered 2016-08-18 (×2): 1.5 g via INTRAVENOUS
  Filled 2016-08-18: qty 1.5

## 2016-08-18 MED ORDER — CHLORHEXIDINE GLUCONATE CLOTH 2 % EX PADS: 6.0000 | MEDICATED_PAD | Freq: Once | CUTANEOUS | Status: DC

## 2016-08-18 MED ORDER — ROCURONIUM BROMIDE 50 MG/5ML IV SOSY
PREFILLED_SYRINGE | INTRAVENOUS | Status: AC
  Filled 2016-08-18: qty 5

## 2016-08-18 MED ORDER — ONDANSETRON HCL 4 MG/2ML IJ SOLN
INTRAMUSCULAR | Status: AC
  Filled 2016-08-18: qty 2

## 2016-08-18 MED ORDER — MIDAZOLAM HCL 5 MG/5ML IJ SOLN
INTRAMUSCULAR | Status: DC | PRN
  Administered 2016-08-18 (×2): 1 mg via INTRAVENOUS

## 2016-08-18 MED ORDER — HEMOSTATIC AGENTS (NO CHARGE) OPTIME
TOPICAL | Status: DC | PRN
  Administered 2016-08-18 (×2): 1 via TOPICAL

## 2016-08-18 MED ORDER — LIDOCAINE 2% (20 MG/ML) 5 ML SYRINGE
INTRAMUSCULAR | Status: AC
  Filled 2016-08-18: qty 5

## 2016-08-18 MED ORDER — NITROGLYCERIN 0.4 MG SL SUBL: 0.4000 mg | SUBLINGUAL_TABLET | SUBLINGUAL | Status: DC | PRN

## 2016-08-18 MED ORDER — PHENOL 1.4 % MT LIQD: 1.0000 | OROMUCOSAL | Status: DC | PRN

## 2016-08-18 MED ORDER — DEXTROSE-NACL 5-0.45 % IV SOLN
INTRAVENOUS | Status: DC
  Administered 2016-08-18 – 2016-08-19 (×3): via INTRAVENOUS

## 2016-08-18 MED ORDER — METOPROLOL TARTRATE 5 MG/5ML IV SOLN
2.0000 mg | INTRAVENOUS | Status: DC | PRN
  Filled 2016-08-18: qty 5

## 2016-08-18 MED ORDER — ALBUMIN HUMAN 5 % IV SOLN: INTRAVENOUS | Status: DC | PRN

## 2016-08-18 MED ORDER — PROTAMINE SULFATE 10 MG/ML IV SOLN
INTRAVENOUS | Status: DC | PRN
  Administered 2016-08-18: 50 mg via INTRAVENOUS

## 2016-08-18 MED ORDER — FENTANYL CITRATE (PF) 100 MCG/2ML IJ SOLN
INTRAMUSCULAR | Status: DC | PRN
  Administered 2016-08-18: 50 ug via INTRAVENOUS
  Administered 2016-08-18 (×2): 25 ug via INTRAVENOUS
  Administered 2016-08-18 (×3): 50 ug via INTRAVENOUS
  Administered 2016-08-18: 25 ug via INTRAVENOUS
  Administered 2016-08-18: 50 ug via INTRAVENOUS
  Administered 2016-08-18 (×2): 25 ug via INTRAVENOUS

## 2016-08-18 MED ORDER — EPHEDRINE 5 MG/ML INJ
INTRAVENOUS | Status: AC
  Filled 2016-08-18: qty 10

## 2016-08-18 MED ORDER — MORPHINE SULFATE (PF) 4 MG/ML IV SOLN
2.0000 mg | INTRAVENOUS | Status: DC | PRN
  Filled 2016-08-18: qty 1

## 2016-08-18 MED ORDER — HEPARIN SODIUM (PORCINE) 1000 UNIT/ML IJ SOLN
INTRAMUSCULAR | Status: AC
  Filled 2016-08-18: qty 1

## 2016-08-18 MED ORDER — MIDAZOLAM HCL 2 MG/2ML IJ SOLN
INTRAMUSCULAR | Status: AC
  Filled 2016-08-18: qty 2

## 2016-08-18 MED ORDER — METOPROLOL TARTRATE 5 MG/5ML IV SOLN
2.5000 mg | Freq: Four times a day (QID) | INTRAVENOUS | Status: DC
  Administered 2016-08-19 – 2016-08-20 (×5): 2.5 mg via INTRAVENOUS
  Filled 2016-08-18 (×5): qty 5

## 2016-08-18 MED ORDER — FENTANYL CITRATE (PF) 250 MCG/5ML IJ SOLN
INTRAMUSCULAR | Status: AC
  Filled 2016-08-18: qty 5

## 2016-08-18 MED ORDER — VASOPRESSIN 20 UNIT/ML IV SOLN
INTRAVENOUS | Status: AC
Start: 2016-08-18 — End: 2016-08-18
  Filled 2016-08-18: qty 1

## 2016-08-18 MED ORDER — SUGAMMADEX SODIUM 200 MG/2ML IV SOLN
INTRAVENOUS | Status: DC | PRN
  Administered 2016-08-18: 160 mg via INTRAVENOUS

## 2016-08-18 MED ORDER — PROPOFOL 10 MG/ML IV BOLUS
INTRAVENOUS | Status: DC | PRN
  Administered 2016-08-18 (×2): 50 mg via INTRAVENOUS

## 2016-08-18 MED ORDER — SODIUM CHLORIDE 0.9 % IV SOLN
INTRAVENOUS | Status: DC | PRN
  Administered 2016-08-18: 09:00:00 500 mL

## 2016-08-18 MED ORDER — SCOPOLAMINE 1 MG/3DAYS TD PT72
1.0000 | MEDICATED_PATCH | TRANSDERMAL | Status: DC
  Filled 2016-08-18: qty 1

## 2016-08-18 MED ORDER — GUAIFENESIN-DM 100-10 MG/5ML PO SYRP
15.0000 mL | ORAL_SOLUTION | ORAL | Status: DC | PRN
Start: 2016-08-18 — End: 2016-08-21

## 2016-08-18 MED ORDER — ALBUMIN HUMAN 5 % IV SOLN
INTRAVENOUS | Status: DC | PRN
  Administered 2016-08-18 (×3): via INTRAVENOUS

## 2016-08-18 MED ORDER — BISACODYL 10 MG RE SUPP: 10.0000 mg | Freq: Every day | RECTAL | Status: DC | PRN

## 2016-08-18 MED ORDER — PROTAMINE SULFATE 10 MG/ML IV SOLN
INTRAVENOUS | Status: AC
  Filled 2016-08-18: qty 5

## 2016-08-18 MED ORDER — LACTATED RINGERS IV SOLN
INTRAVENOUS | Status: DC | PRN
  Administered 2016-08-18: 07:00:00 via INTRAVENOUS

## 2016-08-18 MED ORDER — PHENYLEPHRINE 40 MCG/ML (10ML) SYRINGE FOR IV PUSH (FOR BLOOD PRESSURE SUPPORT)
PREFILLED_SYRINGE | INTRAVENOUS | Status: AC
  Filled 2016-08-18: qty 10

## 2016-08-18 MED ORDER — SODIUM CHLORIDE 0.9 % IV SOLN: Freq: Once | INTRAVENOUS | Status: DC

## 2016-08-18 MED ORDER — PANTOPRAZOLE SODIUM 40 MG IV SOLR
40.0000 mg | Freq: Every day | INTRAVENOUS | Status: DC
  Administered 2016-08-18 – 2016-08-19 (×2): 40 mg via INTRAVENOUS
  Filled 2016-08-18 (×2): qty 40

## 2016-08-18 MED ORDER — ACETAMINOPHEN 325 MG PO TABS: 325.0000 mg | ORAL_TABLET | ORAL | Status: DC | PRN

## 2016-08-18 MED ORDER — CEFUROXIME SODIUM 1.5 G IJ SOLR
1.5000 g | INTRAMUSCULAR | Status: DC
  Filled 2016-08-18: qty 1.5

## 2016-08-18 MED ORDER — ONDANSETRON HCL 4 MG/2ML IJ SOLN
INTRAMUSCULAR | Status: DC | PRN
Start: 2016-08-18 — End: 2016-08-18
  Administered 2016-08-18 (×2): 4 mg via INTRAVENOUS

## 2016-08-18 MED ORDER — HEPARIN SODIUM (PORCINE) 1000 UNIT/ML IJ SOLN
INTRAMUSCULAR | Status: DC | PRN
  Administered 2016-08-18: 1000 [IU] via INTRAVENOUS
  Administered 2016-08-18: 8000 [IU] via INTRAVENOUS
  Administered 2016-08-18: 1000 [IU] via INTRAVENOUS

## 2016-08-18 MED ORDER — DEXTROSE 5 % IV SOLN
1.5000 g | INTRAVENOUS | Status: DC
  Filled 2016-08-18: qty 1.5

## 2016-08-18 MED ORDER — ACETAMINOPHEN 325 MG RE SUPP: 325.0000 mg | RECTAL | Status: DC | PRN

## 2016-08-18 MED ORDER — SODIUM CHLORIDE 0.9 % IV SOLN: INTRAVENOUS | Status: DC

## 2016-08-18 MED ORDER — PHENYLEPHRINE HCL 10 MG/ML IJ SOLN
INTRAVENOUS | Status: DC | PRN
  Administered 2016-08-18: 13:00:00 via INTRAVENOUS
  Administered 2016-08-18: 35 ug/min via INTRAVENOUS

## 2016-08-18 MED ORDER — LACTATED RINGERS IV SOLN
INTRAVENOUS | Status: DC | PRN
  Administered 2016-08-18 (×2): via INTRAVENOUS

## 2016-08-18 MED ORDER — SODIUM CHLORIDE 0.9 % IV SOLN
INTRAVENOUS | Status: DC | PRN
  Administered 2016-08-18: 2.8 [IU]/h via INTRAVENOUS

## 2016-08-18 MED ORDER — ROCURONIUM BROMIDE 100 MG/10ML IV SOLN
INTRAVENOUS | Status: DC | PRN
  Administered 2016-08-18: 20 mg via INTRAVENOUS
  Administered 2016-08-18: 50 mg via INTRAVENOUS
  Administered 2016-08-18 (×2): 10 mg via INTRAVENOUS
  Administered 2016-08-18: 15 mg via INTRAVENOUS
  Administered 2016-08-18: 50 mg via INTRAVENOUS
  Administered 2016-08-18 (×3): 20 mg via INTRAVENOUS

## 2016-08-18 MED ORDER — HYDROMORPHONE HCL 1 MG/ML IJ SOLN: 0.2500 mg | INTRAMUSCULAR | Status: DC | PRN

## 2016-08-18 MED ORDER — MANNITOL 25 % IV SOLN
25.0000 g | Freq: Once | INTRAVENOUS | Status: DC
  Filled 2016-08-18: qty 100

## 2016-08-18 MED ORDER — DOCUSATE SODIUM 100 MG PO CAPS
100.0000 mg | ORAL_CAPSULE | Freq: Every day | ORAL | Status: DC
  Administered 2016-08-20 – 2016-08-21 (×2): 100 mg via ORAL
  Filled 2016-08-18 (×3): qty 1

## 2016-08-18 MED ORDER — LACTATED RINGERS IV SOLN
INTRAVENOUS | Status: DC | PRN
  Administered 2016-08-18 (×2): via INTRAVENOUS

## 2016-08-18 MED ORDER — ONDANSETRON HCL 4 MG/2ML IJ SOLN
4.0000 mg | Freq: Four times a day (QID) | INTRAMUSCULAR | Status: DC | PRN
  Administered 2016-08-18 – 2016-08-19 (×4): 4 mg via INTRAVENOUS
  Filled 2016-08-18 (×4): qty 2

## 2016-08-18 MED ORDER — SCOPOLAMINE 1 MG/3DAYS TD PT72
MEDICATED_PATCH | TRANSDERMAL | Status: DC | PRN
  Administered 2016-08-18: 1 via TRANSDERMAL

## 2016-08-18 MED ORDER — MAGNESIUM SULFATE 2 GM/50ML IV SOLN
2.0000 g | Freq: Once | INTRAVENOUS | Status: DC | PRN
  Filled 2016-08-18: qty 50

## 2016-08-18 MED ORDER — SODIUM CHLORIDE 0.9 % IV SOLN: 500.0000 mL | Freq: Once | INTRAVENOUS | Status: DC | PRN

## 2016-08-18 MED ORDER — HYDRALAZINE HCL 20 MG/ML IJ SOLN: 5.0000 mg | INTRAMUSCULAR | Status: DC | PRN

## 2016-08-18 MED ORDER — ROCURONIUM BROMIDE 50 MG/5ML IV SOSY
PREFILLED_SYRINGE | INTRAVENOUS | Status: AC
  Filled 2016-08-18: qty 10

## 2016-08-18 MED ORDER — LABETALOL HCL 5 MG/ML IV SOLN: 10.0000 mg | INTRAVENOUS | Status: DC | PRN

## 2016-08-18 MED ORDER — PHENYLEPHRINE HCL 10 MG/ML IJ SOLN
INTRAMUSCULAR | Status: DC | PRN
  Administered 2016-08-18: 40 ug via INTRAVENOUS
  Administered 2016-08-18: 20 ug via INTRAVENOUS
  Administered 2016-08-18: 40 ug via INTRAVENOUS
  Administered 2016-08-18 (×3): 20 ug via INTRAVENOUS
  Administered 2016-08-18: 40 ug via INTRAVENOUS
  Administered 2016-08-18: 80 ug via INTRAVENOUS
  Administered 2016-08-18: 40 ug via INTRAVENOUS
  Administered 2016-08-18: 20 ug via INTRAVENOUS
  Administered 2016-08-18: 40 ug via INTRAVENOUS
  Administered 2016-08-18 (×3): 20 ug via INTRAVENOUS
  Administered 2016-08-18: 40 ug via INTRAVENOUS
  Administered 2016-08-18: 80 ug via INTRAVENOUS
  Administered 2016-08-18: 40 ug via INTRAVENOUS
  Administered 2016-08-18 (×4): 20 ug via INTRAVENOUS

## 2016-08-18 MED ORDER — SUGAMMADEX SODIUM 200 MG/2ML IV SOLN
INTRAVENOUS | Status: AC
  Filled 2016-08-18: qty 2

## 2016-08-18 MED ORDER — PANTOPRAZOLE SODIUM 40 MG PO TBEC
40.0000 mg | DELAYED_RELEASE_TABLET | Freq: Every day | ORAL | Status: DC
  Filled 2016-08-18: qty 1

## 2016-08-18 MED ORDER — LIDOCAINE HCL (CARDIAC) 20 MG/ML IV SOLN
INTRAVENOUS | Status: DC | PRN
  Administered 2016-08-18: 100 mg via INTRAVENOUS

## 2016-08-18 MED ORDER — DEXTROSE 5 % IV SOLN
1.5000 g | Freq: Two times a day (BID) | INTRAVENOUS | Status: AC
  Administered 2016-08-18 – 2016-08-19 (×2): 1.5 g via INTRAVENOUS
  Filled 2016-08-18 (×2): qty 1.5

## 2016-08-18 SURGICAL SUPPLY — 74 items
ADH SKN CLS APL DERMABOND .7 (GAUZE/BANDAGES/DRESSINGS) ×2
ADH SKN CLS LQ APL DERMABOND (GAUZE/BANDAGES/DRESSINGS) ×3
APPLICATOR COTTON TIP 6IN STRL (MISCELLANEOUS) ×2 IMPLANT
CANISTER SUCT 3000ML PPV (MISCELLANEOUS) ×3 IMPLANT
CLIP TI MEDIUM 24 (CLIP) ×5 IMPLANT
CLIP TI WIDE RED SMALL 24 (CLIP) ×3 IMPLANT
COVER MAYO STAND STRL (DRAPES) ×1 IMPLANT
COVER PROBE W GEL 5X96 (DRAPES) ×2 IMPLANT
DERMABOND ADHESIVE PROPEN (GAUZE/BANDAGES/DRESSINGS) ×6
DERMABOND ADVANCED (GAUZE/BANDAGES/DRESSINGS) ×4
DERMABOND ADVANCED .7 DNX12 (GAUZE/BANDAGES/DRESSINGS) ×2 IMPLANT
DERMABOND ADVANCED .7 DNX6 (GAUZE/BANDAGES/DRESSINGS) IMPLANT
ELECT BLADE 4.0 EZ CLEAN MEGAD (MISCELLANEOUS) ×3
ELECT BLADE 6.5 EXT (BLADE) IMPLANT
ELECT CAUTERY BLADE 6.4 (BLADE) IMPLANT
ELECT REM PT RETURN 9FT ADLT (ELECTROSURGICAL) ×3
ELECTRODE BLDE 4.0 EZ CLN MEGD (MISCELLANEOUS) ×1 IMPLANT
ELECTRODE REM PT RTRN 9FT ADLT (ELECTROSURGICAL) ×1 IMPLANT
FELT TEFLON 1X6 (MISCELLANEOUS) ×2 IMPLANT
FELT TEFLON 4 X1 (Mesh General) IMPLANT
GLOVE BIO SURGEON STRL SZ 6.5 (GLOVE) ×2 IMPLANT
GLOVE BIO SURGEON STRL SZ7 (GLOVE) ×6 IMPLANT
GLOVE BIO SURGEONS STRL SZ 6.5 (GLOVE) ×2
GLOVE BIOGEL PI IND STRL 6.5 (GLOVE) IMPLANT
GLOVE BIOGEL PI IND STRL 7.0 (GLOVE) IMPLANT
GLOVE BIOGEL PI IND STRL 7.5 (GLOVE) ×1 IMPLANT
GLOVE BIOGEL PI INDICATOR 6.5 (GLOVE) ×4
GLOVE BIOGEL PI INDICATOR 7.0 (GLOVE) ×6
GLOVE BIOGEL PI INDICATOR 7.5 (GLOVE) ×2
GLOVE SURG SS PI 6.0 STRL IVOR (GLOVE) ×2 IMPLANT
GLOVE SURG SS PI 7.5 STRL IVOR (GLOVE) ×3 IMPLANT
GOWN STRL NON-REIN LRG LVL3 (GOWN DISPOSABLE) ×2 IMPLANT
GOWN STRL REUS W/ TWL LRG LVL3 (GOWN DISPOSABLE) ×2 IMPLANT
GOWN STRL REUS W/ TWL XL LVL3 (GOWN DISPOSABLE) ×1 IMPLANT
GOWN STRL REUS W/TWL LRG LVL3 (GOWN DISPOSABLE) ×9
GOWN STRL REUS W/TWL XL LVL3 (GOWN DISPOSABLE) ×3
GRAFT HEMASHIELD 14X8MM (Vascular Products) ×2 IMPLANT
HEMOSTAT SNOW SURGICEL 2X4 (HEMOSTASIS) ×2 IMPLANT
INSERT FOGARTY 61MM (MISCELLANEOUS) ×3 IMPLANT
INSERT FOGARTY SM (MISCELLANEOUS) ×8 IMPLANT
KIT BASIN OR (CUSTOM PROCEDURE TRAY) ×3 IMPLANT
KIT ROOM TURNOVER OR (KITS) ×3 IMPLANT
LOOP VESSEL MINI RED (MISCELLANEOUS) ×2 IMPLANT
NS IRRIG 1000ML POUR BTL (IV SOLUTION) ×6 IMPLANT
PACK AORTA (CUSTOM PROCEDURE TRAY) ×3 IMPLANT
PAD ARMBOARD 7.5X6 YLW CONV (MISCELLANEOUS) ×6 IMPLANT
PENCIL BUTTON HOLSTER BLD 10FT (ELECTRODE) IMPLANT
PUNCH AORTIC ROTATE 5MM 8IN (MISCELLANEOUS) ×2 IMPLANT
SPONGE LAP 18X18 X RAY DECT (DISPOSABLE) ×6 IMPLANT
SURGICEL SNOW 4INWX4INL (HEMOSTASIS) ×2 IMPLANT
SUT ETHIBOND 5 LR DA (SUTURE) IMPLANT
SUT PDS AB 1 TP1 54 (SUTURE) ×6 IMPLANT
SUT PROLENE 3 0 SH 48 (SUTURE) ×14 IMPLANT
SUT PROLENE 5 0 C 1 24 (SUTURE) ×2 IMPLANT
SUT PROLENE 5 0 C 1 36 (SUTURE) IMPLANT
SUT PROLENE 6 0 BV (SUTURE) ×2 IMPLANT
SUT PROLENE 6 0 C 1 30 (SUTURE) ×2 IMPLANT
SUT SILK 2 0 (SUTURE) ×3
SUT SILK 2 0 TIES 17X18 (SUTURE) ×3
SUT SILK 2 0SH CR/8 30 (SUTURE) ×3 IMPLANT
SUT SILK 2-0 18XBRD TIE 12 (SUTURE) ×1 IMPLANT
SUT SILK 2-0 18XBRD TIE BLK (SUTURE) ×1 IMPLANT
SUT SILK 3 0 (SUTURE) ×3
SUT SILK 3 0 TIES 17X18 (SUTURE) ×3
SUT SILK 3-0 18XBRD TIE 12 (SUTURE) ×1 IMPLANT
SUT SILK 3-0 18XBRD TIE BLK (SUTURE) ×1 IMPLANT
SUT VIC AB 2-0 CT1 27 (SUTURE) ×15
SUT VIC AB 2-0 CT1 TAPERPNT 27 (SUTURE) ×5 IMPLANT
SUT VIC AB 3-0 SH 27 (SUTURE) ×6
SUT VIC AB 3-0 SH 27X BRD (SUTURE) ×2 IMPLANT
SUT VICRYL 4-0 PS2 18IN ABS (SUTURE) ×14 IMPLANT
TOWEL BLUE STERILE X RAY DET (MISCELLANEOUS) ×6 IMPLANT
TRAY FOLEY W/METER SILVER 16FR (SET/KITS/TRAYS/PACK) ×3 IMPLANT
WATER STERILE IRR 1000ML POUR (IV SOLUTION) ×4 IMPLANT

## 2016-08-18 NOTE — Anesthesia Procedure Notes (Signed)
Procedure Name: Intubation Date/Time: 08/18/2016 7:47 AM Performed by: Tressia Miners LEFFEW Pre-anesthesia Checklist: Patient identified, Emergency Drugs available, Suction available, Patient being monitored and Timeout performed Patient Re-evaluated:Patient Re-evaluated prior to inductionOxygen Delivery Method: Circle system utilized Preoxygenation: Pre-oxygenation with 100% oxygen Intubation Type: IV induction Ventilation: Mask ventilation without difficulty Laryngoscope Size: Mac and 3 Grade View: Grade II Tube type: Oral Tube size: 7.5 mm Number of attempts: 1 Airway Equipment and Method: Stylet Placement Confirmation: ETT inserted through vocal cords under direct vision,  positive ETCO2 and breath sounds checked- equal and bilateral Secured at: 22 cm Tube secured with: Tape Dental Injury: Teeth and Oropharynx as per pre-operative assessment  Comments: E. Delight Stare, [place]

## 2016-08-18 NOTE — Interval H&P Note (Signed)
History and Physical Interval Note:  08/18/2016 7:24 AM  Olivia [name]  has presented today for surgery, with the diagnosis of Aortic occlusion I74.0; Peripheral vascular disease with bilateral lower extremity claudication I70.213  The various methods of treatment have been discussed with the patient and family. After consideration of risks, benefits and other options for treatment, the patient has consented to  Procedure(s): AORTOBIFEMORAL BYPASS GRAFT (Bilateral) as a surgical intervention .  The patient's history has been reviewed, patient examined, no change in status, stable for surgery.  I have reviewed the patient's chart and labs.  Questions were answered to the patient's satisfaction.     Durene Cal

## 2016-08-18 NOTE — Anesthesia Postprocedure Evaluation (Addendum)
Anesthesia Post Note  Patient: KYLY BAERT  Procedure(s) Performed: Procedure(s) (LRB): AORTOBIFEMORAL BYPASS GRAFT using 68mm x 10mm hemashield graft (Bilateral)  Patient location during evaluation: PACU Anesthesia Type: General Level of consciousness: awake and oriented Pain management: pain level controlled Vital Signs Assessment: post-procedure vital signs reviewed and stable Respiratory status: spontaneous breathing, nonlabored ventilation, respiratory function stable and patient connected to nasal cannula oxygen Cardiovascular status: blood pressure returned to baseline and stable Postop Assessment: no signs of nausea or vomiting Anesthetic complications: no       Last Vitals:  Vitals:   08/18/16 0722 08/18/16 0723  BP:    Pulse: 77 79  Resp: (!) 9 19  Temp:      Last Pain:  Vitals:   08/18/16 1545  TempSrc:   PainSc: Asleep                 Deryck Hippler,JAMES TERRILL

## 2016-08-18 NOTE — H&P (View-Only) (Signed)
[facility] 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 female who returns today for 2 reasons.  The first is that she is status post right carotid endarterectomy on 07/23/2016.  Second his preoperative evaluation for aortobifemoral bypass graft.  Thank you vascular workup she was found to have an occluded left carotid artery and a greater than 80% right carotid stenosis.  She was asymptomatic.  On 07/23/2016 she underwent right carotid endarterectomy for asymptomatic stenosis.  Intraoperative findings included an 80% stenosis with a very high lesion.  Her postoperative course was uncomplicated.  She is discharged on the following day.  She has no complaints today.  She denies any numbness or weakness.  She has no trouble eating.  The patient also suffers from lifestyle limiting claudication.  She gets buttock cramping and loss of feeling in her legs when she walks less than 50 feet.  Her right leg is more severe than the left.  She does not have any ulcers.  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  The patient has a history of a STEMI and is status post PCI in 2012.  She has a significant family history for cardiovascular disease.  She is a current smoker, smoking 1-1/2 packs per day.  She suffers from diabetes which is not well controlled.  Her A1c has been greater than 9 in the past.  She is on a statin for hypercholesterolemia and an ACE inhibitor for hypertension.     PAST MEDICAL HISTORY:   Past Medical History:  Diagnosis Date  . Arthritis    back  . 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 Coronary artery disease   . Diabetes mellitus without complication  (HCC)    Type II  . Hypertension   . Hypothyroidism   . Myocardial infarction 2012     FAMILY HISTORY:   Family History  Problem Relation Age of Onset  . Diabetes Mother   . Diabetes Father   . Diabetes Brother   . Diabetes Brother     SOCIAL HISTORY:   Social History  Substance Use Topics  . Smoking status: Current Every Day Smoker    Packs/day: 1.50    Years: 40.00    Types: Cigarettes    Start date: 01/03/1976  . Smokeless tobacco: Never Used  . Alcohol use No     ALLERGIES:   Allergies  Allergen Reactions  . No Known Allergies      CURRENT MEDICATIONS:   Current Outpatient Prescriptions  Medication Sig Dispense Refill  . aspirin EC 81 MG tablet Take 81 mg by mouth daily.    Marland Kitchen gabapentin (NEURONTIN) 400 MG capsule Take 400 mg by mouth 3 (three) times daily.    Marland Kitchen glipiZIDE (GLUCOTROL) 10 MG tablet Take 10 mg by mouth daily before breakfast.    . JARDIANCE 25 MG TABS tablet Take 1 tablet by mouth daily.    Marland Kitchen levothyroxine (SYNTHROID, LEVOTHROID) 25 MCG tablet Take 25 mcg by mouth daily before breakfast.    . lisinopril (PRINIVIL,ZESTRIL) 5 MG tablet Take 1 tablet (5 mg total) by mouth daily.    . metFORMIN (GLUCOPHAGE) 1000 MG tablet Take 1,000 mg  by mouth 2 (two) times daily with a meal.    . metoprolol (LOPRESSOR) 50 MG tablet TAKE ONE TABLET BY MOUTH TWICE DAILY 180 tablet 3  . nitroGLYCERIN (NITROSTAT) 0.4 MG SL tablet Place 1 tablet (0.4 mg total) under the tongue every 5 (five) minutes as needed for chest pain. 25 tablet 3  . oxyCODONE-acetaminophen (ROXICET) 5-325 MG tablet Take 1 tablet by mouth every 6 (six) hours as needed for severe pain. 8 tablet 0  . pravastatin (PRAVACHOL) 40 MG tablet Take 40 mg by mouth daily.    . vitamin E 400 UNIT capsule Take 400 Units by mouth daily.     No current facility-administered medications for this visit.     REVIEW OF SYSTEMS:   [X]  denotes positive finding, [ ]  denotes negative finding Cardiac  Comments:    Chest pain or chest pressure:    Shortness of breath upon exertion:    Short of breath when lying flat:    Irregular heart rhythm:        Vascular    Pain in calf, thigh, or hip brought on by ambulation: x   Pain in feet at night that wakes you up from your sleep:     Blood clot in your veins:    Leg swelling:         Pulmonary    Oxygen at home:    Productive cough:     Wheezing:         Neurologic    Sudden weakness in arms or legs:  x   Sudden numbness in arms or legs:  x   Sudden onset of difficulty speaking or slurred speech:    Temporary loss of vision in one eye:     Problems with dizziness:         Gastrointestinal    Blood in stool:     Vomited blood:         Genitourinary    Burning when urinating:     Blood in urine:        Psychiatric    Major depression:         Hematologic    Bleeding problems:    Problems with blood clotting too easily:        Skin    Rashes or ulcers:        Constitutional    Fever or chills:      PHYSICAL EXAM:   Vitals:   08/09/16 0832  BP: 120/82  Pulse: (!) 103  Resp: 18  Temp: 98.3 F (36.8 C)  TempSrc: Oral  SpO2: 97%  Weight: 179 lb 14.4 oz (81.6 kg)  Height: 5\' 3"  (1.6 m)    GENERAL: The patient is a well-nourished female, in no acute distress. The vital signs are documented above. CARDIAC: There is a regular rate and rhythm.  VASCULAR: Femoral and pedal pulses are not palpable.  Her right carotid incision is healing nicely. PULMONARY: Non-labored respirations ABDOMEN: Soft and non-tender with normal pitched bowel sounds.  MUSCULOSKELETAL: There are no major deformities or cyanosis. NEUROLOGIC: No focal weakness or paresthesias are detected. SKIN: There are no ulcers or rashes noted. PSYCHIATRIC: The patient has a normal affect.  STUDIES:   I again reviewed her CT angiogram of the abdomen and pelvis.  This shows aortic occlusion with reconstitution of bilateral common external iliac arteries.  The study  stops in the mid thigh.  MEDICAL ISSUES:   #1: Carotid stenosis: The patient is status post  right carotid endarterectomy.  She has had no postoperative issues.  She will be scheduled for follow-up ultrasound in 6 months.  Aortic occlusion: We discussed proceeding with aortobifemoral bypass graft.  I discussed the risks and benefits of the operation including the risk of stroke, intestinal ischemia, renal failure, bleeding, death, wound issues.  All of her questions were answered.  She is eager to get this taken care of because of her leg symptoms.  Her only abdominal surgery has been a cholecystectomy.  I have scheduled her operation for Wednesday, March 28.  I am going to repeat her CT angiogram so that I can have a preoperative idea of her runoff vessels since her initial CT scan stopped at the knee.    Durene Cal, MD [facility] [phone] Pager [phone]

## 2016-08-18 NOTE — Transfer of Care (Signed)
Immediate Anesthesia Transfer of Care Note  Patient: Olivia [name]  Procedure(s) Performed: Procedure(s): AORTOBIFEMORAL BYPASS GRAFT using 24mm x 31mm hemashield graft (Bilateral)  Patient Location: PACU  Anesthesia Type:General  Level of Consciousness: awake, patient cooperative and responds to stimulation  Airway & Oxygen Therapy: Patient Spontanous Breathing and Patient connected to face mask oxygen  Post-op Assessment: Report given to RN, Post -op Vital signs reviewed and stable and Patient moving all extremities X 4  Post vital signs: Reviewed and stable  Last Vitals:  Vitals:   08/18/16 0722 08/18/16 0723  BP:    Pulse: 77 79  Resp: (!) 9 19  Temp:      Last Pain:  Vitals:   08/18/16 0611  TempSrc: Oral      Patients Stated Pain Goal: 2 (08/18/16 2761)  Complications: No apparent anesthesia complications

## 2016-08-18 NOTE — Anesthesia Procedure Notes (Addendum)
Central Venous Catheter Insertion Performed by: Sharee Holster, anesthesiologist Start/End3/28/2018 7:00 AM, 08/18/2016 7:16 AM Preanesthetic checklist: patient identified, IV checked, site marked, risks and benefits discussed, surgical consent, monitors and equipment checked, pre-op evaluation and timeout performed Position: Trendelenburg Lidocaine 1% used for infiltration Hand hygiene performed , maximum sterile barriers used  and Seldinger technique used Catheter size: 8 Fr Central line was placed.Procedure performed using ultrasound guided technique. Ultrasound Notes:anatomy identified, needle tip was noted to be adjacent to the nerve/plexus identified and image(s) printed for medical record Following insertion, line sutured, dressing applied and Biopatch. Post procedure assessment: blood return through all ports and free fluid flow  Patient tolerated the procedure well with no immediate complications.

## 2016-08-18 NOTE — Anesthesia Preprocedure Evaluation (Signed)
Anesthesia Evaluation  Patient identified by MRN, date of birth, ID band Patient awake    Reviewed: Allergy & Precautions, NPO status , Patient's Chart, lab work & pertinent test results  History of Anesthesia Complications Negative for: history of anesthetic complications  Airway Mallampati: II  TM Distance: >3 FB Neck ROM: Full    Dental  (+) Dental Advisory Given, Teeth Intact   Pulmonary neg shortness of breath, neg sleep apnea, neg COPD, neg recent URI, Current Smoker,    breath sounds clear to auscultation       Cardiovascular hypertension, Pt. on medications + CAD, + Past MI, + Cardiac Stents and + Peripheral Vascular Disease   Rhythm:Regular Rate:Normal     Neuro/Psych negative neurological ROS  negative psych ROS   GI/Hepatic negative GI ROS, Neg liver ROS,   Endo/Other  diabetes, Type 2, Oral Hypoglycemic AgentsHypothyroidism   Renal/GU negative Renal ROS     Musculoskeletal  (+) Arthritis ,   Abdominal   Peds  Hematology   Anesthesia Other Findings   Reproductive/Obstetrics                             Anesthesia Physical Anesthesia Plan  ASA: III  Anesthesia Plan: General   Post-op Pain Management:    Induction: Intravenous  Airway Management Planned: Oral ETT  Additional Equipment: Arterial line and CVP  Intra-op Plan:   Post-operative Plan: Possible Post-op intubation/ventilation and Extubation in OR  Informed Consent: I have reviewed the patients History and Physical, chart, labs and discussed the procedure including the risks, benefits and alternatives for the proposed anesthesia with the patient or authorized representative who has indicated his/her understanding and acceptance.   Dental advisory given  Plan Discussed with:   Anesthesia Plan Comments:         Anesthesia Quick Evaluation

## 2016-08-18 NOTE — Progress Notes (Signed)
Patient did not take Metoprolol this AM, notified Dr. Jacklynn Bue, holding Metoprolol per MD order.

## 2016-08-18 NOTE — Progress Notes (Signed)
  Day of Surgery Note    Subjective:  Sleeping-awakes to stimulus  Vitals:   08/18/16 1555 08/18/16 1600  BP: 123/66   Pulse: 93 94  Resp: 17 16  Temp:     PCXR 08/18/16: IMPRESSION: No acute findings demonstrated. Mildly increased right basilar atelectasis. Persistent pulmonary nodularity, appearing improved from CT of 5 weeks ago. Again, this is probably inflammatory, although continued follow-up recommended.  Abdominal XRAY 08/18/16: IMPRESSION: Nasogastric tube tip in the proximal stomach with side hole near the GE junction.  Incisions:   Laparotomy and bilateral groin incisions are clean and dry Extremities:  Easily palpable bilateral DP pulses and left PT pulse Cardiac:  regular Lungs:  Non labored Abdomen:  Soft, non distended   Assessment/Plan:  This is a 56 y.o. female who is s/p aortobifemoral bypass  -pt extubated and O2 Sats 90's -easily palpable bilateral DP pulses and left PT pulse -pt requiring neo at this time and being weaned -a line is inaccurate and ready 30 points less than cuff-will dc a line -continue NGT and npo -to [facility] surgical ICU when bed available   Doreatha Massed, PA-C 08/18/2016 4:07 PM [phone]

## 2016-08-18 NOTE — Progress Notes (Signed)
eLink Physician-Brief Progress Note Patient Name: Olivia [name] DOB: May 23, 1961 MRN: [id number]   Date of Service  08/18/2016  HPI/Events of Note  #1: eval aortic occlusion  #2: Post-op carotid endarterectomy right   eICU Interventions  No acute issues post op follow up vasc surgery recs No new interventions at this time     Intervention Category Evaluation Type: New Patient Evaluation  Erin Fulling 08/18/2016, 4:52 PM

## 2016-08-19 ENCOUNTER — Inpatient Hospital Stay (HOSPITAL_COMMUNITY): Payer: BLUE CROSS/BLUE SHIELD

## 2016-08-19 ENCOUNTER — Encounter (HOSPITAL_COMMUNITY): Payer: Self-pay | Admitting: Surgery

## 2016-08-19 LAB — POCT I-STAT 7, (LYTES, BLD GAS, ICA,H+H)
ACID-BASE DEFICIT: 1 mmol/L (ref 0.0–2.0)
Acid-Base Excess: 1 mmol/L (ref 0.0–2.0)
BICARBONATE: 26.5 mmol/L (ref 20.0–28.0)
Bicarbonate: 24.2 mmol/L (ref 20.0–28.0)
CALCIUM ION: 1.16 mmol/L (ref 1.15–1.40)
Calcium, Ion: 1.19 mmol/L (ref 1.15–1.40)
HCT: 39 % (ref 36.0–46.0)
HCT: 42 % (ref 36.0–46.0)
HEMOGLOBIN: 13.3 g/dL (ref 12.0–15.0)
Hemoglobin: 14.3 g/dL (ref 12.0–15.0)
O2 SAT: 100 %
O2 SAT: 100 %
PCO2 ART: 43.9 mmHg (ref 32.0–48.0)
PH ART: 7.38 (ref 7.350–7.450)
PO2 ART: 375 mmHg — AB (ref 83.0–108.0)
PO2 ART: 201 mmHg — AB (ref 83.0–108.0)
Potassium: 3.4 mmol/L — ABNORMAL LOW (ref 3.5–5.1)
Potassium: 3.6 mmol/L (ref 3.5–5.1)
SODIUM: 142 mmol/L (ref 135–145)
Sodium: 141 mmol/L (ref 135–145)
TCO2: 25 mmol/L (ref 0–100)
TCO2: 28 mmol/L (ref 0–100)
pCO2 arterial: 40.9 mmHg (ref 32.0–48.0)
pH, Arterial: 7.388 (ref 7.350–7.450)

## 2016-08-19 LAB — BLOOD GAS, ARTERIAL
Acid-base deficit: 1.2 mmol/L (ref 0.0–2.0)
Bicarbonate: 22.8 mmol/L (ref 20.0–28.0)
Drawn by: 365271
O2 CONTENT: 2.5 L/min
O2 SAT: 93.4 %
PCO2 ART: 37 mmHg (ref 32.0–48.0)
PH ART: 7.408 (ref 7.350–7.450)
PO2 ART: 71.5 mmHg — AB (ref 83.0–108.0)
Patient temperature: 98.6

## 2016-08-19 LAB — COMPREHENSIVE METABOLIC PANEL
ALT: 18 U/L (ref 14–54)
AST: 21 U/L (ref 15–41)
Albumin: 3.3 g/dL — ABNORMAL LOW (ref 3.5–5.0)
Alkaline Phosphatase: 50 U/L (ref 38–126)
Anion gap: 11 (ref 5–15)
BUN: 8 mg/dL (ref 6–20)
CHLORIDE: 105 mmol/L (ref 101–111)
CO2: 23 mmol/L (ref 22–32)
CREATININE: 0.67 mg/dL (ref 0.44–1.00)
Calcium: 8.3 mg/dL — ABNORMAL LOW (ref 8.9–10.3)
GFR calc Af Amer: >60 mL/min (ref >60)
Glucose, Bld: 221 mg/dL — ABNORMAL HIGH (ref 65–99)
Potassium: 3.7 mmol/L (ref 3.5–5.1)
Sodium: 139 mmol/L (ref 135–145)
Total Bilirubin: 1.3 mg/dL — ABNORMAL HIGH (ref 0.3–1.2)
Total Protein: 5.4 g/dL — ABNORMAL LOW (ref 6.5–8.1)

## 2016-08-19 LAB — POCT I-STAT 4, (NA,K, GLUC, HGB,HCT)
GLUCOSE: 137 mg/dL — AB (ref 65–99)
Glucose, Bld: 198 mg/dL — ABNORMAL HIGH (ref 65–99)
Glucose, Bld: 166 mg/dL — ABNORMAL HIGH (ref 65–99)
Glucose, Bld: 129 mg/dL — ABNORMAL HIGH (ref 65–99)
Glucose, Bld: 149 mg/dL — ABNORMAL HIGH (ref 65–99)
Glucose, Bld: 134 mg/dL — ABNORMAL HIGH (ref 65–99)
Glucose, Bld: 118 mg/dL — ABNORMAL HIGH (ref 65–99)
HCT: 43 % (ref 36.0–46.0)
HCT: 44 % (ref 36.0–46.0)
HCT: 46 % (ref 36.0–46.0)
HCT: 37 % (ref 36.0–46.0)
HCT: 38 % (ref 36.0–46.0)
HEMATOCRIT: 43 % (ref 36.0–46.0)
HEMATOCRIT: 40 % (ref 36.0–46.0)
HEMOGLOBIN: 14.6 g/dL (ref 12.0–15.0)
HEMOGLOBIN: 14.6 g/dL (ref 12.0–15.0)
HEMOGLOBIN: 13.6 g/dL (ref 12.0–15.0)
Hemoglobin: 15 g/dL (ref 12.0–15.0)
Hemoglobin: 15.6 g/dL — ABNORMAL HIGH (ref 12.0–15.0)
Hemoglobin: 12.6 g/dL (ref 12.0–15.0)
Hemoglobin: 12.9 g/dL (ref 12.0–15.0)
POTASSIUM: 4.1 mmol/L (ref 3.5–5.1)
POTASSIUM: 3.3 mmol/L — AB (ref 3.5–5.1)
Potassium: 3.8 mmol/L (ref 3.5–5.1)
Potassium: 3.9 mmol/L (ref 3.5–5.1)
Potassium: 3.6 mmol/L (ref 3.5–5.1)
Potassium: 3.2 mmol/L — ABNORMAL LOW (ref 3.5–5.1)
Potassium: 3.8 mmol/L (ref 3.5–5.1)
SODIUM: 139 mmol/L (ref 135–145)
SODIUM: 143 mmol/L (ref 135–145)
Sodium: 140 mmol/L (ref 135–145)
Sodium: 140 mmol/L (ref 135–145)
Sodium: 141 mmol/L (ref 135–145)
Sodium: 143 mmol/L (ref 135–145)
Sodium: 144 mmol/L (ref 135–145)

## 2016-08-19 LAB — CBC
HEMATOCRIT: 39.8 % (ref 36.0–46.0)
Hemoglobin: 13.4 g/dL (ref 12.0–15.0)
MCH: 29.5 pg (ref 26.0–34.0)
MCHC: 33.7 g/dL (ref 30.0–36.0)
MCV: 87.5 fL (ref 78.0–100.0)
PLATELETS: 164 10*3/uL (ref 150–400)
RBC: 4.55 MIL/uL (ref 3.87–5.11)
RDW: 14.2 % (ref 11.5–15.5)
WBC: 12.6 10*3/uL — ABNORMAL HIGH (ref 4.0–10.5)

## 2016-08-19 LAB — GLUCOSE, CAPILLARY
GLUCOSE-CAPILLARY: 225 mg/dL — AB (ref 65–99)
GLUCOSE-CAPILLARY: 197 mg/dL — AB (ref 65–99)
GLUCOSE-CAPILLARY: 124 mg/dL — AB (ref 65–99)
Glucose-Capillary: 245 mg/dL — ABNORMAL HIGH (ref 65–99)

## 2016-08-19 LAB — AMYLASE: Amylase: 19 U/L — ABNORMAL LOW (ref 28–100)

## 2016-08-19 LAB — POCT ACTIVATED CLOTTING TIME: Activated Clotting Time: 0 seconds

## 2016-08-19 LAB — MAGNESIUM: Magnesium: 1.4 mg/dL — ABNORMAL LOW (ref 1.7–2.4)

## 2016-08-19 MED ORDER — KETOROLAC TROMETHAMINE 30 MG/ML IJ SOLN
30.0000 mg | Freq: Four times a day (QID) | INTRAMUSCULAR | Status: DC
  Administered 2016-08-19 – 2016-08-21 (×7): 30 mg via INTRAVENOUS
  Filled 2016-08-19 (×7): qty 1

## 2016-08-19 MED ORDER — NICOTINE 21 MG/24HR TD PT24
21.0000 mg | MEDICATED_PATCH | Freq: Every day | TRANSDERMAL | Status: DC
  Administered 2016-08-19 – 2016-08-21 (×3): 21 mg via TRANSDERMAL
  Filled 2016-08-19 (×3): qty 1

## 2016-08-19 MED ORDER — INSULIN GLARGINE 100 UNIT/ML ~~LOC~~ SOLN
12.0000 [IU] | Freq: Every day | SUBCUTANEOUS | Status: DC
  Administered 2016-08-19 – 2016-08-21 (×3): 12 [IU] via SUBCUTANEOUS
  Filled 2016-08-19 (×3): qty 0.12

## 2016-08-19 MED ORDER — ENOXAPARIN SODIUM 40 MG/0.4ML ~~LOC~~ SOLN
40.0000 mg | Freq: Two times a day (BID) | SUBCUTANEOUS | Status: DC
  Administered 2016-08-19: 40 mg via SUBCUTANEOUS
  Filled 2016-08-19: qty 0.4

## 2016-08-19 MED ORDER — HYDROMORPHONE HCL 1 MG/ML IJ SOLN: 1.0000 mg | INTRAMUSCULAR | Status: DC | PRN

## 2016-08-19 MED FILL — Heparin Sodium (Porcine) Inj 1000 Unit/ML: INTRAMUSCULAR | Qty: 30 | Status: AC

## 2016-08-19 MED FILL — Sodium Chloride IV Soln 0.9%: INTRAVENOUS | Qty: 2000 | Status: AC

## 2016-08-19 NOTE — Op Note (Signed)
Patient name: Olivia [name] MRN: [id number] DOB: Oct 14, 1960 Sex: female  08/18/2016 Pre-operative Diagnosis: aortic occlusion Post-operative diagnosis:  Same Surgeon:  Durene Cal Assistants:  Alyssa Grove Procedure:   #1:  aorto-bifemoral bypass with 14x8 mm dacryon graft   #2:  Re-implantation of inferior mesenteric artery   #3  Lysis of adhesion x 30 minutes Anesthesia:  General Blood Loss:  See anesthesia record Specimens:  none  Findings:  End to end proximal anastamosis.  Both femoral anastamosis were to the distal common femoral arteries extending onto the origin of the profunda.  The IMA was implanted onto the left lateral side of the tube portion of the graft.  She had a high left femoral bifurcation.  Indications:  The patient suffers from bilateral claudication.  Imaging revealed an occluded aorta.  She is here for revascularization  Procedure:  The patient was identified in the holding area and taken to [facility] OR ROOM 12  The patient was then placed supine on the table. general anesthesia was administered.  The patient was prepped and draped in the usual sterile fashion.  A time out was called and antibiotics were administered.  Oblique incisions were made in each groin.  The CFA, PFA, and SFA were individually isolated.  She had a high left femoral bifurcation up near the inguinal ligament.  The circumflex iliac veins were divided on each side with 2-0 silk ties.  Wet gauze was place in each groin  A midline incision was made from the xiphoid to below the umbilicus.  Cautery was used to expose the fascia.  The peritoneum was entered sharply.  I spent approximately 30 minutes performing lysis of adhesions as she had omentum attached to her abdominal wall and pelvis.  The abdomen was inspected.  There was no gross pathology.  The omni tract was set up.  The transverse colon was reflected cephalad, and the small bowel mobilized to the patient's right.  The ligament of Treitz was taken  down sharply.  I divided the IMV.  I then exposed the aorta circumferentially at the level of the renal arteries.  I then isolated the IMA.  A tunnel was tehn created down to each femoral artery, making sure to stay posterior to the ureters.  The patient was fully heparinized.  The aorta was then occluded proximally with a Hrakin clamp and distally with a Zanger clamp.  A bulldog was placed on the IMA.  I then opened the aorta with a #11 blade and transected it with mayo scissors.  I had to perform a endarterectomy down to the IMA so that I could close the distal aorta with 3-0 prolene in 2 layers.  I then performed aortic endarterectomy of the proximal aorta up to the clamp.  A 14x8 dacryon graft was selected, and a running anastamosis was performed with 3-0 prolene, encorperating a felt strip.  Once the anastamosis was complete, the clamp was removed and there was good hemostasis.  Each limb of the graft was brought through the corresponding tunnel into each groin.  At this point I berformed each femoral anastamosis.  I did the left side first.  Both femoral arteries were soft without significant plaque.   Both anastamosis were to the distal common femoral arteries, extending slightly on to the profunda.  Blood flow was returned sequentially to each leg after the appropriate flushing maneuvers were done.  There were excellent doppler signals in the PFA nd SFA bilaterally.  The groins were then packed  and attention was turned back to the abdomen.  I then selected a cooley J clamp and placed it on the left lateral side of the tube portion of the aortic graft.  Once applied, I ligated the IMA at its origin with a 2-0 silk tie.  There was moderate back-bleeding from the IMA.  I occluded it with a Seraphin clamp.  The end was spatulated.  I then used a #11 blade to open the aortic graft.  This was further opened with a #5 punch.  A end to side anastamosis was created with a 6-0 prolene.  Once this was completed, the  clamp was removed.  There was an excellent doppler signal in the IMA.  At this point, I reversed the heparin with 50mg  of Protamine.  The abdomen was irrigated and then packed with lap pads.  I went back to the groins and achieved hemostasis.  Once the groins were dry, I re-approximated the femoral sheath with 2-0 vicryl.  The subcutaneous layers were closed with multiple lalyers of 3-0 vicryl, followed by 4-0 vicryl on the skin.  I then went back to the abdomen.  It was reasonably dry.  I made sure there was no further bleeding.  I then closed the retroperitoneum with 2-0 vicryl.  The retractor was removed.  The bowel was run and there were no defects.  It was placed back into its anatomic position.  I closed the fascia with 2 #1PDS sutures.  The subcutaneous tissue was closed with 2-0 vicryl and the skin was closed with 4-0 vicryl.  Dermabond was applied to the skin.  The patient was then successfully extubated and taken to the PACU in stable condition.  She had palpable pedal pulses   Disposition:  To PACU stable   V. Durene Cal, M.D. [facility] [place] Office: [phone] Pager:  [phone]

## 2016-08-19 NOTE — Progress Notes (Addendum)
Subjective  - POD #1, s/p aorto bifem BPG with IMA re-implantation for aortic occlusion  Complaining about NG tube and that it is making her nauseated Mild abdominal pain No flatus  Physical Exam:  Palpable pedal pulses Incisions c/d/I Very agitated    Assessment/Plan:  POD #1  GI:  Will take HG tube out as the patient is not tolerating it very well.  Keep NPO GU:  Good UOP.  D/c Foley CV:  D/c a-line as it is not accurate F/E/N;  Decrease IVF to 50cc/hr, replace lytes per protocol.  Repeat labs in am ID:  abx for surgical prophylaxis Prophylaxis:  Start lovenox.  Continue Protonix PT?OT, mobilize DM:  Continue SSI Acute blood loss anemia:  Hb stable, received cell saver in OR.  Repeat CBC in am Pain;  Changing morphine to dilaudid as patient thinks morphine is causing nausea.  Also adding Toradol q6 hours for 3 days.  Will evaluate renal function in am   Olivia [name], [name] 08/19/2016 5:20 PM --  Vitals:   08/19/16 1515 08/19/16 1600  BP: 110/67 118/73  Pulse: (!) 105 (!) 109  Resp: 19 (!) 24  Temp: 97.8 F (36.6 C)     Intake/Output Summary (Last 24 hours) at 08/19/16 1720 Last data filed at 08/19/16 1600  Gross per 24 hour  Intake          2114.99 ml  Output             2701 ml  Net          -586.01 ml     Laboratory CBC    Component Value Date/Time   WBC 12.6 (H) 08/19/2016 0500   HGB 13.4 08/19/2016 0500   HCT 39.8 08/19/2016 0500   PLT 164 08/19/2016 0500    BMET    Component Value Date/Time   NA 139 08/19/2016 0500   NA 137 07/13/2016 1526   K 3.7 08/19/2016 0500   CL 105 08/19/2016 0500   CO2 23 08/19/2016 0500   GLUCOSE 221 (H) 08/19/2016 0500   BUN 8 08/19/2016 0500   BUN 20 07/13/2016 1526   CREATININE 0.67 08/19/2016 0500   CREATININE 0.73 07/07/2016 1127   CALCIUM 8.3 (L) 08/19/2016 0500   GFRNONAA >60 08/19/2016 0500   GFRNONAA >89 07/07/2016 1127   GFRAA >60 08/19/2016 0500   GFRAA >89 07/07/2016 1127    COAG Lab Results   Component Value Date   INR 1.10 08/18/2016   INR 0.85 08/17/2016   INR 0.88 07/22/2016   No results found for: PTT  Antibiotics Anti-infectives    Start     Dose/Rate Route Frequency Ordered Stop   08/18/16 2300  cefUROXime (ZINACEF) 1.5 g in dextrose 5 % 50 mL IVPB     1.5 g 100 mL/hr over 30 Minutes Intravenous Every 12 hours 08/18/16 1658 08/19/16 1050   08/18/16 1100  cefUROXime (ZINACEF) injection 1.5 g  Status:  Discontinued     1.5 g Intramuscular To Surgery 08/18/16 1046 08/18/16 1048   08/18/16 1100  cefUROXime (ZINACEF) 1.5 g in dextrose 5 % 50 mL IVPB  Status:  Discontinued     1.5 g 100 mL/hr over 30 Minutes Intravenous To Surgery 08/18/16 1048 08/18/16 1635   08/18/16 0607  cefUROXime (ZINACEF) 1.5 g in dextrose 5 % 50 mL IVPB     1.5 g 100 mL/hr over 30 Minutes Intravenous 30 min pre-op 08/18/16 0607 08/18/16 1140       V.  Leia Alf, M.D. [facility] [place] Office: [phone] Pager:  [phone]

## 2016-08-19 NOTE — Progress Notes (Signed)
Inpatient Diabetes Program Recommendations  AACE/ADA: New Consensus Statement on Inpatient Glycemic Control (2015)  Target Ranges:  Prepandial:   less than 140 mg/dL      Peak postprandial:   less than 180 mg/dL (1-2 hours)      Critically ill patients:  140 - 180 mg/dL   Lab Results  Component Value Date   GLUCAP 197 (H) 08/19/2016   HGBA1C 10.2 (H) 08/17/2016    Review of Glycemic Control:  Results for AYDEE, CLARA (MRN [id number]) as of 08/19/2016 11:32  Ref. Range 08/17/2016 14:01 08/18/2016 06:20 08/18/2016 14:16 08/18/2016 15:40 08/18/2016 17:43 08/18/2016 22:19 08/19/2016 08:07  Glucose-Capillary Latest Ref Range: 65 - 99 mg/dL 324 (H) 401 (H) 027 (H) 141 (H) 181 (H) 225 (H) 197 (H)   Diabetes history: Type 2 diabetes Outpatient Diabetes medications: Glipizide10 mg with breakfast, Jardiance 25 mg q PM, Metformin 1000 mg bid with meals Current orders for Inpatient glycemic control:  Novolog moderate tid with meals  Inpatient Diabetes Program Recommendations:    Note that patient has elevated A1C.  Called and discussed with Doreatha Massed, PA.  Orders received for Lantus 12 units daily.   Thanks, Beryl Meager, RN, BC-ADM Inpatient Diabetes Coordinator Pager [phone] (8a-5p)

## 2016-08-19 NOTE — Progress Notes (Signed)
OT Cancellation Note  Patient Details Name: Olivia [name] MRN: [id number] DOB: 1961-02-17   Cancelled Treatment:    Reason Eval/Treat Not Completed: Other (comment). Pt miserable right now with ng tube and cannot get comfortable. Spoke to RN in room and she is hoping that ng tube will be able to come out today and feels that pt would be better seen once it is out. Will try back later as schedule permits.  Evette Georges [phone] 08/19/2016, 8:52 AM

## 2016-08-19 NOTE — Evaluation (Signed)
Physical Therapy Evaluation Patient Details Name: ELLISSA [name] MRN: [id number] DOB: 05-18-1961 Today's Date: 08/19/2016   History of Present Illness  Pt is a 56 y.o. female s/p aortobifemoral bypass graft bilaterally. PMH consists of OA, CAD, PVD with claudication, DM, HTN, and MI.  Clinical Impression  Pt admitted with above diagnosis. Pt currently with functional limitations due to the deficits listed below (see PT Problem List). On eval, pt required min assist bed mobility, min assist transfers, and min guard assist ambulation 5 feet with RW. Ambulation distance limited by pain. Pt also NPO at time of eval which affected her energy level/ability to participate. Pt will benefit from skilled PT to increase their independence and safety with mobility to allow discharge to the venue listed below.  Pt reports her husband is able to provide needed level of assist at home.     Follow Up Recommendations No PT follow up;Supervision/Assistance - 24 hour    Equipment Recommendations  None recommended by PT    Recommendations for Other Services       Precautions / Restrictions Precautions Precautions: Fall      Mobility  Bed Mobility Overal bed mobility: Needs Assistance Bed Mobility: Supine to Sit     Supine to sit: Min assist     General bed mobility comments: assist to elevate trunk  Transfers Overall transfer level: Needs assistance Equipment used: Ambulation equipment used Transfers: Sit to/from [employer] Transfers Sit to Stand: Min assist Stand pivot transfers: Min assist       General transfer comment: verbal cues for sequencing and hand placement, assist to power up  Ambulation/Gait Ambulation/Gait assistance: Min guard Ambulation Distance (Feet): 5 Feet Assistive device: Rolling walker (2 wheeled) Gait Pattern/deviations: Step-through pattern;Decreased stride length Gait velocity: decreased Gait velocity interpretation: Below normal speed for  age/gender General Gait Details: distance limited by pain. HR 125 during mobility.  Stairs            Wheelchair Mobility    Modified Rankin (Stroke Patients Only)       Balance Overall balance assessment: No apparent balance deficits (not formally assessed)                                           Pertinent Vitals/Pain Pain Assessment: 0-10 Pain Score: 5  Pain Location: sx site Pain Descriptors / Indicators: Guarding;Grimacing;Sore Pain Intervention(s): Monitored during session;Limited activity within patient's tolerance;Repositioned    Home Living Family/patient expects to be discharged to:: Private residence Living Arrangements: Spouse/significant other Available Help at Discharge: Family;Available 24 hours/day Type of Home: House Home Access: Stairs to enter Entrance Stairs-Rails: Doctor, general practice of Steps: 2 Home Layout: Multi-level;Able to live on main level with bedroom/bathroom Home Equipment: Walker - 4 wheels;Cane - single point      Prior Function Level of Independence: Independent with assistive device(s)         Comments: RW for ambulation outside the home and cane for household distances.     Hand Dominance        Extremity/Trunk Assessment   Upper Extremity Assessment Upper Extremity Assessment: Defer to OT evaluation    Lower Extremity Assessment Lower Extremity Assessment: Overall WFL for tasks assessed    Cervical / Trunk Assessment Cervical / Trunk Assessment: Normal  Communication   Communication: No difficulties  Cognition Arousal/Alertness: Awake/alert Behavior During Therapy: WFL for tasks assessed/performed Overall Cognitive Status:  Within Functional Limits for tasks assessed                                        General Comments      Exercises     Assessment/Plan    PT Assessment Patient needs continued PT services  PT Problem List Decreased activity  tolerance;Pain;Decreased mobility       PT Treatment Interventions Gait training;Stair training;Functional mobility training;Therapeutic activities;Therapeutic exercise;Patient/family education    PT Goals (Current goals can be found in the Care Plan section)  Acute Rehab PT Goals Patient Stated Goal: home PT Goal Formulation: With patient Time For Goal Achievement: 09/02/16 Potential to Achieve Goals: Good    Frequency Min 3X/week   Barriers to discharge        Co-evaluation               End of Session Equipment Utilized During Treatment: Gait belt Activity Tolerance: Patient tolerated treatment well Patient left: in chair;with call bell/phone within reach Nurse Communication: Mobility status PT Visit Diagnosis: Difficulty in walking, not elsewhere classified (R26.2);Pain    Time: 1607-3710 PT Time Calculation (min) (ACUTE ONLY): 24 min   Charges:   PT Evaluation $PT Eval Moderate Complexity: 1 Procedure PT Treatments $Therapeutic Activity: 8-22 mins   PT G Codes:        Aida Raider, PT  Office # [phone] Pager [phone]   Ilda Foil 08/19/2016, 12:08 PM

## 2016-08-20 LAB — CBC
HCT: 35.6 % — ABNORMAL LOW (ref 36.0–46.0)
Hemoglobin: 11.6 g/dL — ABNORMAL LOW (ref 12.0–15.0)
MCH: 29.1 pg (ref 26.0–34.0)
MCHC: 32.6 g/dL (ref 30.0–36.0)
MCV: 89.2 fL (ref 78.0–100.0)
PLATELETS: 156 10*3/uL (ref 150–400)
RBC: 3.99 MIL/uL (ref 3.87–5.11)
RDW: 14.4 % (ref 11.5–15.5)
WBC: 9.9 10*3/uL (ref 4.0–10.5)

## 2016-08-20 LAB — BASIC METABOLIC PANEL
ANION GAP: 10 (ref 5–15)
BUN: 11 mg/dL (ref 6–20)
CALCIUM: 8.5 mg/dL — AB (ref 8.9–10.3)
CO2: 28 mmol/L (ref 22–32)
Chloride: 104 mmol/L (ref 101–111)
Creatinine, Ser: 0.6 mg/dL (ref 0.44–1.00)
GFR calc Af Amer: >60 mL/min (ref >60)
Glucose, Bld: 138 mg/dL — ABNORMAL HIGH (ref 65–99)
POTASSIUM: 3.4 mmol/L — AB (ref 3.5–5.1)
Sodium: 142 mmol/L (ref 135–145)

## 2016-08-20 LAB — TYPE AND SCREEN
ABO/RH(D): B POS
Antibody Screen: NEGATIVE
UNIT DIVISION: 0
Unit division: 0

## 2016-08-20 LAB — GLUCOSE, CAPILLARY
GLUCOSE-CAPILLARY: 150 mg/dL — AB (ref 65–99)
GLUCOSE-CAPILLARY: 134 mg/dL — AB (ref 65–99)
GLUCOSE-CAPILLARY: 190 mg/dL — AB (ref 65–99)
Glucose-Capillary: 144 mg/dL — ABNORMAL HIGH (ref 65–99)
Glucose-Capillary: 126 mg/dL — ABNORMAL HIGH (ref 65–99)

## 2016-08-20 LAB — BPAM RBC
Blood Product Expiration Date: 201804122359
Blood Product Expiration Date: 201804132359
ISSUE DATE / TIME: 201803280827
ISSUE DATE / TIME: 201803280827
UNIT TYPE AND RH: 7300
Unit Type and Rh: 7300

## 2016-08-20 MED ORDER — PANTOPRAZOLE SODIUM 40 MG PO TBEC
40.0000 mg | DELAYED_RELEASE_TABLET | Freq: Every day | ORAL | Status: DC
  Administered 2016-08-20 – 2016-08-21 (×2): 40 mg via ORAL
  Filled 2016-08-20 (×2): qty 1

## 2016-08-20 MED ORDER — PRAVASTATIN SODIUM 40 MG PO TABS
40.0000 mg | ORAL_TABLET | Freq: Every day | ORAL | Status: DC
  Administered 2016-08-20: 40 mg via ORAL
  Filled 2016-08-20: qty 1

## 2016-08-20 MED ORDER — OXYCODONE-ACETAMINOPHEN 5-325 MG PO TABS: 1.0000 | ORAL_TABLET | ORAL | Status: DC | PRN

## 2016-08-20 MED ORDER — GABAPENTIN 400 MG PO CAPS
400.0000 mg | ORAL_CAPSULE | Freq: Three times a day (TID) | ORAL | Status: DC
  Administered 2016-08-20 – 2016-08-21 (×3): 400 mg via ORAL
  Filled 2016-08-20 (×3): qty 1

## 2016-08-20 MED ORDER — ENOXAPARIN SODIUM 40 MG/0.4ML ~~LOC~~ SOLN
40.0000 mg | SUBCUTANEOUS | Status: DC
  Administered 2016-08-20: 40 mg via SUBCUTANEOUS
  Filled 2016-08-20: qty 0.4

## 2016-08-20 MED ORDER — LEVOTHYROXINE SODIUM 25 MCG PO TABS
25.0000 ug | ORAL_TABLET | Freq: Every day | ORAL | Status: DC
  Administered 2016-08-20 – 2016-08-21 (×2): 25 ug via ORAL
  Filled 2016-08-20 (×2): qty 1

## 2016-08-20 MED ORDER — METOPROLOL TARTRATE 25 MG PO TABS
25.0000 mg | ORAL_TABLET | Freq: Two times a day (BID) | ORAL | Status: DC
  Administered 2016-08-20 – 2016-08-21 (×3): 25 mg via ORAL
  Filled 2016-08-20 (×3): qty 1

## 2016-08-20 MED ORDER — ASPIRIN EC 81 MG PO TBEC
81.0000 mg | DELAYED_RELEASE_TABLET | Freq: Every evening | ORAL | Status: DC
  Administered 2016-08-20: 81 mg via ORAL
  Filled 2016-08-20: qty 1

## 2016-08-20 MED ORDER — OXYCODONE-ACETAMINOPHEN 5-325 MG PO TABS: 1.0000 | ORAL_TABLET | Freq: Four times a day (QID) | ORAL | 0 refills | Status: DC | PRN

## 2016-08-20 NOTE — Progress Notes (Signed)
OT Cancellation Note  Patient Details Name: Olivia [name] MRN: [id number] DOB: 06-12-1960   Cancelled Treatment:    Reason Eval/Treat Not Completed: OT screened, no needs identified, will sign off. Pt is performing ADL and ADL transfers at a modified independent level. Offered pt information on seated showering. No further OT needs.  Evern Bio 08/20/2016, 4:09 PM

## 2016-08-20 NOTE — Progress Notes (Addendum)
Spoke with patient in regards to her diabetes and her HgbA1C of 10.2%. Patient states that she was diagnosed with diabetes "years ago" and that she has been on oral meds all the time.  She states that she refuses to take insulin.  She does not know what her past A1C's have been. Her PCP is a physician in [place], [place] and was last in to see her in November, 2017. Her States that she checks her blood sugars 3 times per week per doctor's orders.  Is very confused about what to eat. States that she doesn't think that she can eat whole wheat bread and drinks smoothies with fruit and sweetened yogurt at least 3 times per day. Recommend that a dietician give her some meal planning ideas and choices of foods that she can eat.  Recommend that patient go back on her oral meds at discharge and see her PCP for follow up.   Smith Mince RN BSN CDE Diabetes Coordinator Pager: [phone]

## 2016-08-20 NOTE — Discharge Summary (Signed)
[facility] Discharge Summary    Olivia [name] 21-Jun-1960 56 y.o. 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 female who returns today for 2 reasons.  The first is that she is status post right carotid endarterectomy on 07/23/2016.  Second his preoperative evaluation for aortobifemoral bypass graft.  Thank you vascular workup she was found to have an occluded left carotid artery and a greater than 80% right carotid stenosis.  She was asymptomatic.  On 07/23/2016 she underwent right carotid endarterectomy for asymptomatic stenosis.  Intraoperative findings included an 80% stenosis with a very high lesion.  Her postoperative course was uncomplicated.  She is discharged on the following day.  She has no complaints today.  She denies any numbness or weakness.  She has no trouble eating.  The patient also suffers from lifestyle limiting claudication.  She gets buttock cramping and loss of feeling in her legs when she walks less than 50 feet.  Her right leg is more severe than the left.  She does not have any ulcers.  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  The patient has a history of a STEMI and is status post PCI in 2012. She has a significant family history for cardiovascular disease. She is a current smoker, smoking 1-1/2 packs per day. She suffers from diabetes which is not well controlled. Her A1c has been greater than 9 in the past. She is on a statin for hypercholesterolemia and an ACE inhibitor for hypertension.  Hospital Course:  The patient was admitted to the hospital and taken to the operating room on 08/18/2016 and underwent: Procedure:    #1:  aorto-bifemoral bypass with 14x8 mm dacryon graft #2:  Re-implantation of inferior mesenteric artery #3  Lysis  of adhesion x 30 minutes    Intraoperative findings include the following: End to end proximal anastamosis.  Both femoral anastamosis were to the distal common femoral arteries extending onto the origin of the profunda.  The IMA was implanted onto the left lateral side of the tube portion of the graft.  She had a high left femoral bifurcation  The pt tolerated the procedure well and was transported to the PACU in good condition.   By POD 1, she was very agitated.  She had palpable pedal pulses and incisions were clean and dry.  Her NGT, foley, a-line were removed.  Her IVF were decreased to 50cc/hr.  Lovenox was started for DVT prophylaxis.  She did have acute surgical blood loss anemia.  Hgb stable and she received cell saver in the OR.  Her morphine was changed to dilaudid as pt thinks morphine is causing nausea.  Toradol was also added for 3 days.     The diabetic coordinator evaluated the pt and Lantus 12 units daily was started.  Her HgbA1c on admission is 10.2.  On POD 2, she is doing well and transferred to the telemetry floor.  Her pain is well controlled.  ABI's pending.   On POD 3, pt was ambulating without difficulty, pain controlled with po pain medication and tolerating diet.  She was discharged home.  The remainder of the hospital course consisted of increasing mobilization and increasing intake of solids without difficulty.  CBC    Component Value Date/Time   WBC 9.9 08/20/2016 0432  RBC 3.99 08/20/2016 0432   HGB 11.6 (L) 08/20/2016 0432   HCT 35.6 (L) 08/20/2016 0432   PLT 156 08/20/2016 0432   MCV 89.2 08/20/2016 0432   MCH 29.1 08/20/2016 0432   MCHC 32.6 08/20/2016 0432   RDW 14.4 08/20/2016 0432   LYMPHSABS 2.2 08/21/2009 1025   MONOABS 0.7 08/21/2009 1025   EOSABS 0.1 08/21/2009 1025   BASOSABS 0.0 08/21/2009 1025    BMET    Component Value Date/Time   NA 142 08/20/2016 0432   NA 137 07/13/2016 1526   K 3.4 (L) 08/20/2016 0432   CL 104 08/20/2016 0432     CO2 28 08/20/2016 0432   GLUCOSE 138 (H) 08/20/2016 0432   BUN 11 08/20/2016 0432   BUN 20 07/13/2016 1526   CREATININE 0.60 08/20/2016 0432   CREATININE 0.73 07/07/2016 1127   CALCIUM 8.5 (L) 08/20/2016 0432   GFRNONAA >60 08/20/2016 0432   GFRNONAA >89 07/07/2016 1127   GFRAA >60 08/20/2016 0432   GFRAA >89 07/07/2016 1127     Discharge Instructions    ABDOMINAL PROCEDURE/ANEURYSM REPAIR/AORTO-BIFEMORAL BYPASS:  Call MD for increased abdominal pain; cramping diarrhea; nausea/vomiting    Complete by:  As directed    Call MD for:  redness, tenderness, or signs of infection (pain, swelling, bleeding, redness, odor or green/yellow discharge around incision site)    Complete by:  As directed    Call MD for:  severe or increased pain, loss or decreased feeling  in affected limb(s)    Complete by:  As directed    Call MD for:  temperature >100.5    Complete by:  As directed    Driving Restrictions    Complete by:  As directed    No driving for 2 weeks   Lifting restrictions    Complete by:  As directed    No lifting for 4 weeks   Resume previous diet    Complete by:  As directed       Discharge Diagnosis:  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 Aortoiliac occlusive disease (HCC) 08/18/2016  . Right-sided extracranial carotid artery stenosis 07/23/2016  . Left-sided carotid artery disease (HCC) 05/04/2016  . Claudication (HCC) 05/04/2016  . Coronary artery disease due to lipid rich plaque 05/04/2016  . Essential hypertension 05/04/2016  . Hyperlipidemia 05/04/2016  . Diabetes (HCC) 05/04/2016  . Well woman exam with routine gynecological exam 11/11/2014  . HNP (herniated nucleus pulposus), lumbar 06/28/2014   Past Medical History:  Diagnosis Date  . Arthritis    back  . 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 Coronary artery disease   . Diabetes mellitus without complication (HCC)    Type II  . Hypertension   . Hypothyroidism   . Myocardial infarction 2012  . PONV (postoperative nausea and vomiting)      Allergies as of 08/20/2016      Reactions   No Known Allergies       Medication List    TAKE these medications   aspirin EC 81 MG tablet Take 81 mg by mouth every evening.   gabapentin 400 MG capsule Commonly known as:  NEURONTIN Take 400 mg by mouth 3 (three) times daily.   glipiZIDE 10 MG tablet Commonly known as:  GLUCOTROL Take 10 mg by mouth  daily before breakfast.   GOODY HEADACHE PO Take 1 packet by mouth daily as needed (headaches).   JARDIANCE 25 MG Tabs tablet Generic drug:  empagliflozin Take 25 mg by mouth every evening.   levothyroxine 25 MCG tablet Commonly known as:  SYNTHROID, LEVOTHROID Take 25 mcg by mouth daily before breakfast.   lisinopril 5 MG tablet Commonly known as:  PRINIVIL,ZESTRIL Take 1 tablet (5 mg total) by mouth daily.   metFORMIN 1000 MG tablet Commonly known as:  GLUCOPHAGE Take 1,000 mg by mouth 2 (two) times daily with a meal.   metoprolol 50 MG tablet Commonly known as:  LOPRESSOR TAKE ONE TABLET BY MOUTH TWICE DAILY   nitroGLYCERIN 0.4 MG SL tablet Commonly known as:  NITROSTAT Place 1 tablet (0.4 mg total) under the tongue every 5 (five) minutes as needed for chest pain.   oxyCODONE-acetaminophen 5-325 MG tablet Commonly known as:  ROXICET Take 1 tablet by mouth every 6 (six) hours as needed for severe pain.   pravastatin 40 MG tablet Commonly known as:  PRAVACHOL Take 40 mg by mouth at bedtime.   vitamin E 400 UNIT capsule Take 400 Units by mouth daily.       Prescriptions given: 1.  Roxicet #30 No Refill  Instructions: 1.  No driving x 2 weeks and while taking pain medications 2.  No heavy lifting x 4 weeks. 3.  If you have groin incisions, Wash the groin  wound with soap and water daily and pat dry. (No tub bath-only shower)  Then put a dry gauze or washcloth there to keep this area dry daily and as needed.  Do not use Vaseline or neosporin on your incisions.  Only use soap and water on your incisions and then protect and keep dry. 4.  Shower daily starting 08/22/16  Disposition: home  Patient's condition: is Good  Follow up: 1. Dr. Myra Gianotti in 2 weeks   Doreatha Massed, PA-C [facility] [phone] 08/20/2016  9:17 AM   - For VQI Registry use -  Post-op:  Time to Extubation: [x]  In OR, [ ]  < 12 hrs, [ ]  12-24 hrs, [ ]  >=24 hrs Vasopressors Req. Post-op: Yes ICU Stay: 2 day in ICU Transfusion: No   If yes, n/a units given MI: No, [ ]  Troponin only, [ ]  EKG or Clinical New Arrhythmia: No  Complications: CHF: No Resp failure: No, [ ]  Pneumonia, [ ]  Ventilator Chg in renal function: No, [ ]  Inc. Cr > 0.5, [ ]  Temp. Dialysis, [ ]  Permanent dialysis Leg ischemia: No, no Surgery needed, [ ]  Yes, Surgery needed, [ ]  Amputation Bowel ischemia: No, [ ]  Medical Rx, [ ]  Surgical Rx Wound complication: No, [ ]  Superficial separation/infection, [ ]  Return to OR Return to OR: No  Return to OR for bleeding: No Stroke: No, [ ]  Minor, [ ]  Major  Discharge medications: Statin use:  Yes If No:   ASA use:  Yes  If No:   Plavix use:  No  Beta blocker use:  Yes  ACEI use:  Yes ARB use:  No CCB use:  No Coumadin use:  No

## 2016-08-20 NOTE — Progress Notes (Addendum)
qPhysical Therapy Treatment Patient Details Name: Olivia [name] MRN: [id number] DOB: 1960/12/28 Today's Date: 08/20/2016    History of Present Illness Pt is a 56 y.o. female s/p aortobifemoral bypass graft bilaterally. PMH consists of OA, CAD, PVD with claudication, DM, HTN, and MI.    PT Comments    Pt doing well with mobility and no further PT needed.  Ready for dc from PT standpoint. Pt verbalized understanding of stair negotiation and didn't feel she needed to practice these.      Follow Up Recommendations  No PT follow up;Supervision/Assistance - 24 hour     Equipment Recommendations  None recommended by PT    Recommendations for Other Services       Precautions / Restrictions Precautions Precautions: None Restrictions Weight Bearing Restrictions: No    Mobility  Bed Mobility Overal bed mobility: Modified Independent                Transfers Overall transfer level: Modified independent Equipment used: 4-wheeled walker Transfers: Sit to/from Stand Sit to Stand: Modified independent (Device/Increase time)            Ambulation/Gait Ambulation/Gait assistance: Modified independent (Device/Increase time) Ambulation Distance (Feet): 470 Feet Assistive device: 4-wheeled walker Gait Pattern/deviations: Step-through pattern;Decreased stride length   Gait velocity interpretation: at or above normal speed for age/gender General Gait Details: Pt with steady gait with good speed   Stairs            Wheelchair Mobility    Modified Rankin (Stroke Patients Only)       Balance Overall balance assessment: No apparent balance deficits (not formally assessed)                                          Cognition Arousal/Alertness: Awake/alert Behavior During Therapy: WFL for tasks assessed/performed Overall Cognitive Status: Within Functional Limits for tasks assessed                                         Exercises      General Comments        Pertinent Vitals/Pain Pain Assessment: Faces Faces Pain Scale: Hurts little more Pain Location: incision Pain Descriptors / Indicators: Grimacing;Sore;Operative site guarding Pain Intervention(s): Limited activity within patient's tolerance;Monitored during session    Home Living                      Prior Function            PT Goals (current goals can now be found in the care plan section) Progress towards PT goals: Goals met/education completed, patient discharged from PT    Frequency           PT Plan Current plan remains appropriate    Co-evaluation             End of Session   Activity Tolerance: Patient tolerated treatment well Patient left: with call bell/phone within reach;in bed Nurse Communication: Mobility status PT Visit Diagnosis: Difficulty in walking, not elsewhere classified (R26.2)     Time: 4287-6811 PT Time Calculation (min) (ACUTE ONLY): 14 min  Charges:  $Gait Training: 8-22 mins                    G Codes:  [facility] PT [place] 08/20/2016, 3:54 PM

## 2016-08-20 NOTE — Progress Notes (Signed)
Physical Therapy Discharge Patient Details Name: Olivia [name] MRN: [id number] DOB: 02-Sep-1960 Today's Date: 08/20/2016 Time: 0354-6568 PT Time Calculation (min) (ACUTE ONLY): 14 min  Patient discharged from PT services secondary to goals met and no further PT needs identified.  Please see latest therapy progress note for current level of functioning and progress toward goals.    Progress and discharge plan discussed with patient and/or caregiver: Patient/Caregiver agrees with plan  GP     Shary Decamp [facility] 08/20/2016, 3:54 PM  [facility] PT [phone]

## 2016-08-20 NOTE — Progress Notes (Addendum)
Pt's diet is being advanced.  She was started on Lantus 12 units yesterday.  Will defer to DM coordinator order of po DM medications to start back.   Anticipate discharge in next 1-2 days.   Doreatha Massed, [facility] 08/20/2016 9:11 AM

## 2016-08-20 NOTE — Progress Notes (Signed)
   Daily Progress Note   Assessment/Planning: POD #2 s/p ABF w/ IMA reimplantation   Pt tolerating clear: advance as tolerated  Pain controlled  Ok to transfer to floor  Pt may be ready for discharge tomorrow if ambulating ok  ABI pending   Subjective  - 2 Days Post-Op  Pain controlled, wants to eat, wants to leave  Objective Vitals:   08/20/16 0500 08/20/16 0600 08/20/16 0700 08/20/16 0823  BP: 116/72 95/64 (!) 97/52   Pulse: 98 97 100   Resp: 15 17 (!) 21   Temp:    97.7 F (36.5 C)  TempSrc:    Oral  SpO2: 98% 96% 95%   Weight:         Intake/Output Summary (Last 24 hours) at 08/20/16 0857 Last data filed at 08/20/16 0700  Gross per 24 hour  Intake          1321.66 ml  Output             1175 ml  Net           146.66 ml    PULM  CTAB CV  RRR GI  soft, appropriate TTP, inc c/d/I, B inc c/d/i VASC  Both feet warm with faintly palpable PT  Laboratory CBC    Component Value Date/Time   WBC 9.9 08/20/2016 0432   HGB 11.6 (L) 08/20/2016 0432   HCT 35.6 (L) 08/20/2016 0432   PLT 156 08/20/2016 0432    BMET    Component Value Date/Time   NA 142 08/20/2016 0432   NA 137 07/13/2016 1526   K 3.4 (L) 08/20/2016 0432   CL 104 08/20/2016 0432   CO2 28 08/20/2016 0432   GLUCOSE 138 (H) 08/20/2016 0432   BUN 11 08/20/2016 0432   BUN 20 07/13/2016 1526   CREATININE 0.60 08/20/2016 0432   CREATININE 0.73 07/07/2016 1127   CALCIUM 8.5 (L) 08/20/2016 0432   GFRNONAA >60 08/20/2016 0432   GFRNONAA >89 07/07/2016 1127   GFRAA >60 08/20/2016 0432   GFRAA >89 07/07/2016 1127     Leonides Sake, MD, FACS [facility] Office: [phone] Pager: [phone]  08/20/2016, 8:57 AM

## 2016-08-20 NOTE — Plan of Care (Addendum)
Problem: Limited Adherence to Nutrition-Related Recommendations (NB-1.6) Goal: Nutrition education Formal process to instruct or train a patient/client in a skill or to impart knowledge to help patients/clients voluntarily manage or modify food choices and eating behavior to maintain or improve health.  Outcome: Completed/Met Date Met: 08/20/16  RD consulted for nutrition education regarding diabetes.   Lab Results  Component Value Date   HGBA1C 10.2 (H) 08/17/2016    RD provided "Carbohydrate Counting for People with Diabetes" handout from the Academy of [employer] and Dietetics. Discussed different food groups and their effects on blood sugar, emphasizing carbohydrate-containing foods. Provided list of carbohydrates and recommended serving sizes of common foods.  Discussed importance of controlled and consistent carbohydrate intake throughout the day. Provided examples of ways to balance meals/snacks and encouraged intake of high-fiber, whole grain complex carbohydrates. Teach back method used.  Expect fair compliance.  Body mass index is 31.35 kg/m. Pt meets criteria for Obesity Class I based on current BMI.  Current diet order is Carbohydrate Modified.  Labs and medications reviewed.   No further nutrition interventions warranted at this time. RD contact information provided. If additional nutrition issues arise, please re-consult RD.  Arthur Holms, RD, LDN Pager #: [phone] After-Hours Pager #: [phone]

## 2016-08-21 LAB — GLUCOSE, CAPILLARY
GLUCOSE-CAPILLARY: 165 mg/dL — AB (ref 65–99)
GLUCOSE-CAPILLARY: 156 mg/dL — AB (ref 65–99)

## 2016-08-21 MED ORDER — NICOTINE 21 MG/24HR TD PT24: 21.0000 mg | MEDICATED_PATCH | Freq: Every day | TRANSDERMAL | 1 refills | Status: AC

## 2016-08-21 MED ORDER — POTASSIUM CHLORIDE CRYS ER 20 MEQ PO TBCR
40.0000 meq | EXTENDED_RELEASE_TABLET | Freq: Once | ORAL | Status: AC
  Administered 2016-08-21: 40 meq via ORAL
  Filled 2016-08-21: qty 2

## 2016-08-21 NOTE — Progress Notes (Signed)
Order received to discharge patient.  Telemetry monitor removed and CCMD notified.  PIV access removed.  Discharge instructions, follow up, medications and instructions for their use were discussed with patient. 

## 2016-08-21 NOTE — Progress Notes (Signed)
Pt had a 6 beat run of wide QRS. She is resting comfortably with no complaints. Will continue to monitor closely.

## 2016-08-21 NOTE — Progress Notes (Signed)
Pt had an 8 beat run of V tach. She's alert and orient with no complaints of pain or chest pain. She's resting comfortably. Will continue to monitor.

## 2016-08-21 NOTE — Discharge Instructions (Signed)
Abdominal Aortic Aneurysm Endograft Repair, Care After  This sheet gives you information about how to care for yourself after your procedure. Your health care provider may also give you more specific instructions. If you have problems or questions, contact your health care provider.  What can I expect after the procedure?  After the procedure, it is common to have:  · Pain or soreness at the incision site.  · Tiredness (fatigue).    Follow these instructions at home:  Activity   · Get plenty of rest.  · Follow instructions from your health care provider about how much you should move around and how far you should go when you take short walks. Start walking farther when your health care provider says it is okay.  · Limit your activities as told by your health care provider.  · Return to your normal activities as told by your health care provider. Ask your health care provider what activities are safe for you.  Incision care     · Follow instructions from your health care provider about how to take care of your incisions. Make sure you:  ? Wash your hands with soap and water before you change your bandage (dressing). If soap and water are not available, use hand sanitizer.  ? Change your dressing as told by your health care provider.  ? Leave stitches (sutures), skin glue, or adhesive strips in place. These skin closures may need to stay in place for 2 weeks or longer. If adhesive strip edges start to loosen and curl up, you may trim the loose edges. Do not remove adhesive strips completely unless your health care provider tells you to do that.  · Keep the incision area clean and dry.  · Do not take baths, swim, or use a hot tub until your health care provider approves. Ask your health care provider if you can take showers. You may only be allowed to take sponge baths for bathing.  · Check your incision area every day for signs of infection. Check for:  ? More redness, swelling, or pain.  ? More fluid or  blood.  ? Warmth.  ? Pus or a bad smell.  Lifestyle   · Do not use any products that contain nicotine or tobacco, such as cigarettes and e-cigarettes. If you need help quitting, ask your health care provider.  · Make any other lifestyle changes that your health care provider suggests. These may include:  ? Keeping your blood pressure under control.  ? Finding ways to lower stress.  ? Eating healthy foods that are good for your heart, such as vegetables, fruits, and whole grains that add fiber to your diet.  ? Getting regular exercise.  General instructions   · Take over-the-counter and prescription medicines only as told by your health care provider.  · Keep all follow-up visits as told by your health care provider. This is important.  Contact a health care provider if:  · You have pain in your abdomen, chest, or back.  · You have more redness, swelling, or pain around an incision.  · You have more fluid or blood coming from an incision.  · Your incision feels warm to the touch.  · You have pus or a bad smell coming from an incision.  · You have a fever.  Get help right away if:  · You have trouble breathing.  · You suddenly have pain in your legs or you have trouble moving either of your legs.  ·   You faint or you feel very light-headed.  This information is not intended to replace advice given to you by your health care provider. Make sure you discuss any questions you have with your health care provider.  Document Released: 01/29/2015 Document Revised: 11/28/2015 Document Reviewed: 08/04/2015  Elsevier Interactive Patient Education © 2017 Elsevier Inc.

## 2016-08-21 NOTE — Progress Notes (Signed)
   Daily Progress Note   Assessment/Planning: POD #3 s/p ABF w/ IMA reimplantation   Pt amb without difficulty  Pain controlled with PO rx  Tolerating diet  Meets criteria for D/C  Subjective  - 3 Days Post-Op  No complaints, +F/-BM, tol diet  Objective Vitals:   08/20/16 1100 08/20/16 1414 08/20/16 2046 08/21/16 0443  BP: 101/84 101/71 (!) 115/100 110/64  Pulse: (!) 103  (!) 126 79  Resp: (!) 21  19 18   Temp: 98.5 F (36.9 C) 98.6 F (37 C) 98.6 F (37 C) 97.8 F (36.6 C)  TempSrc: Oral  Oral Oral  SpO2: 99% 97% 92% 94%  Weight:         Intake/Output Summary (Last 24 hours) at 08/21/16 0827 Last data filed at 08/20/16 2112  Gross per 24 hour  Intake              222 ml  Output              500 ml  Net             -278 ml    PULM  CTAB CV  RRR GI  soft, appropriate TTP, inc c/d/I, B groin inc c/d/i  VASC  Palpable DP B  Laboratory CBC    Component Value Date/Time   WBC 9.9 08/20/2016 0432   HGB 11.6 (L) 08/20/2016 0432   HCT 35.6 (L) 08/20/2016 0432   PLT 156 08/20/2016 0432    BMET    Component Value Date/Time   NA 142 08/20/2016 0432   NA 137 07/13/2016 1526   K 3.4 (L) 08/20/2016 0432   CL 104 08/20/2016 0432   CO2 28 08/20/2016 0432   GLUCOSE 138 (H) 08/20/2016 0432   BUN 11 08/20/2016 0432   BUN 20 07/13/2016 1526   CREATININE 0.60 08/20/2016 0432   CREATININE 0.73 07/07/2016 1127   CALCIUM 8.5 (L) 08/20/2016 0432   GFRNONAA >60 08/20/2016 0432   GFRNONAA >89 07/07/2016 1127   GFRAA >60 08/20/2016 0432   GFRAA >89 07/07/2016 1127     Leonides Sake, MD, FACS [facility] Office: [phone] Pager: [phone]  08/21/2016, 8:27 AM

## 2016-08-23 ENCOUNTER — Telehealth: Payer: Self-pay | Admitting: Surgery

## 2016-08-23 NOTE — Telephone Encounter (Signed)
-----   Message from Sharee Pimple, RN sent at 08/22/2016  5:32 PM EDT ----- Regarding: 2 weeks   ----- Message ----- From: Dara Lords, PA-C Sent: 08/20/2016   9:14 AM To: [facility] Charge Pool  s/p aortobifem bypass graft.  f/u with Dr. Myra Gianotti in 2 weeks.  Thanks

## 2016-08-23 NOTE — Telephone Encounter (Signed)
spoke to pt on home # mailed lttr for appt on 4/23

## 2016-08-23 NOTE — Addendum Note (Signed)
Addended by: Burton Apley A on: 08/23/2016 03:49 PM   Modules accepted: Orders

## 2016-08-25 ENCOUNTER — Telehealth: Payer: Self-pay | Admitting: Surgery

## 2016-08-25 NOTE — Telephone Encounter (Signed)
Spoke to pt, aware of change in appts, approved new dates and times, as well as CTA Prior 4/26 CTA, 4/30 PO

## 2016-08-25 NOTE — Telephone Encounter (Signed)
-----   Message from Sharee Pimple, RN sent at 08/23/2016 12:50 PM EDT ----- Regarding: 4 weeks with CTA   ----- Message ----- From: Fransisco Hertz, MD Sent: 08/21/2016   8:34 AM To: [address]  Olivia [name] [id number] 01-06-1961  Follow up 4 weeks with Dr. Myra Gianotti with CTA abd/pelvis (re: s/p EVAR)

## 2016-09-06 ENCOUNTER — Telehealth: Payer: Self-pay | Admitting: *Deleted

## 2016-09-06 NOTE — Telephone Encounter (Signed)
Patient called in to Triage reporting that she was having swelling in her ankles and feet x 2 days. She spoke to Dr. Myra Gianotti about this edema and some itching over the weekend. He prescribed her some Prednisone, which she started yesterday. She knows to closely monitor her blood sugars while she is on the Prednisone.  She states that the hives are better but the swelling is still in her feet. She does not have any edema anywhere else including face, or hands. She is afebrile and is not having any problems with bowel or bladder. No pain in her abdomen or back. She says that the swelling in her feet goes down at night but gets worse when she is sitting and or having her legs in a dependent position. I have instructed her on elevating her legs and the reasons behind this treatment. She says that she has been trying to get them up as much as possible but will try to get them up above the level of her heart. She voiced understanding and agreement with this plan. Her postop appt is currently on 09-20-16 after she has her CTA abdomen / pelvis on 09-16-16. She will call us back if needed.

## 2016-09-13 ENCOUNTER — Ambulatory Visit
Admission: RE | Admit: 2016-09-13 | Discharge: 2016-09-13 | Disposition: A | Discharge status: Home / Self Care | Payer: BLUE CROSS/BLUE SHIELD | Source: Ambulatory Visit | Attending: Surgery | Admitting: Surgery

## 2016-09-13 ENCOUNTER — Encounter: Payer: BLUE CROSS/BLUE SHIELD | Admitting: Surgery

## 2016-09-13 DIAGNOSIS — I700 Atherosclerosis of aorta: Secondary | ICD-10-CM

## 2016-09-13 DIAGNOSIS — I7410 Embolism and thrombosis of unspecified parts of aorta: Principal | ICD-10-CM

## 2016-09-13 DIAGNOSIS — T82330A Leakage of aortic (bifurcation) graft (replacement), initial encounter: Secondary | ICD-10-CM

## 2016-09-13 DIAGNOSIS — IMO0001 Reserved for inherently not codable concepts without codable children: Secondary | ICD-10-CM

## 2016-09-13 MED ORDER — IOPAMIDOL (ISOVUE-370) INJECTION 76%
100.0000 mL | Freq: Once | INTRAVENOUS | Status: AC | PRN
  Administered 2016-09-13: 100 mL via INTRAVENOUS

## 2016-09-16 ENCOUNTER — Inpatient Hospital Stay: Admission: RE | Admit: 2016-09-16 | Payer: BLUE CROSS/BLUE SHIELD | Source: Ambulatory Visit

## 2016-09-20 ENCOUNTER — Encounter: Payer: Self-pay | Admitting: Surgery

## 2016-09-20 ENCOUNTER — Ambulatory Visit (INDEPENDENT_AMBULATORY_CARE_PROVIDER_SITE_OTHER): Payer: Self-pay | Admitting: Surgery

## 2016-09-20 ENCOUNTER — Encounter: Payer: Self-pay | Admitting: *Deleted

## 2016-09-20 VITALS — BP 94/58 | HR 76 | Temp 98.2°F | Resp 18 | Ht 63.0 in | Wt 173.0 lb

## 2016-09-20 DIAGNOSIS — I7410 Embolism and thrombosis of unspecified parts of aorta: Secondary | ICD-10-CM

## 2016-09-20 DIAGNOSIS — I700 Atherosclerosis of aorta: Secondary | ICD-10-CM

## 2016-09-20 NOTE — Progress Notes (Signed)
Patient name: Olivia [name] MRN: [id number] DOB: 1961/05/13 Sex: female  REASON FOR VISIT:   Post op ABF    HISTORY OF PRESENT ILLNESS:   The patient is back for follow-up.  She is status post right carotid endarterectomy on 07/23/2016.  This was done for a high-grade right carotid stenosis which was asymptomatic.  Intraoperative findings included an 80% stenosis with a very high lesion.  Her postoperative course was uncomplicated.  The patient was having lifestyle limiting claudication at 50 feet.  A CT scan shows aortic graft occlusion.  Therefore on 08/18/2016, she underwent an aortobifemoral bypass graft using a 14 x 8 graft with reimplantation of the inferior mesenteric artery.  She is here today for follow-up.  She has no complaints.  She has a significant improvement in her quality of life.  CURRENT MEDICATIONS:    Current Outpatient Prescriptions  Medication Sig Dispense Refill  . aspirin EC 81 MG tablet Take 81 mg by mouth every evening.     . Aspirin-Acetaminophen-Caffeine (GOODY HEADACHE PO) Take 1 packet by mouth daily as needed (headaches).    . gabapentin (NEURONTIN) 400 MG capsule Take 400 mg by mouth 3 (three) times daily.    Marland Kitchen glipiZIDE (GLUCOTROL) 10 MG tablet Take 10 mg by mouth daily before breakfast.    . JARDIANCE 25 MG TABS tablet Take 25 mg by mouth every evening.     Marland Kitchen levothyroxine (SYNTHROID, LEVOTHROID) 25 MCG tablet Take 25 mcg by mouth daily before breakfast.    . lisinopril (PRINIVIL,ZESTRIL) 5 MG tablet Take 1 tablet (5 mg total) by mouth daily.    . metFORMIN (GLUCOPHAGE) 1000 MG tablet Take 1,000 mg by mouth 2 (two) times daily with a meal.    . metoprolol (LOPRESSOR) 50 MG tablet TAKE ONE TABLET BY MOUTH TWICE DAILY 180 tablet 3  . nicotine (NICODERM CQ - DOSED IN MG/24 HOURS) 21 mg/24hr patch Place 1 patch (21 mg total) onto the skin daily. 28 patch 1  . nitroGLYCERIN (NITROSTAT) 0.4 MG SL tablet Place 1 tablet (0.4  mg total) under the tongue every 5 (five) minutes as needed for chest pain. 25 tablet 3  . oxyCODONE-acetaminophen (ROXICET) 5-325 MG tablet Take 1 tablet by mouth every 6 (six) hours as needed for severe pain. 30 tablet 0  . pravastatin (PRAVACHOL) 40 MG tablet Take 40 mg by mouth at bedtime.     . vitamin E 400 UNIT capsule Take 400 Units by mouth daily.     No current facility-administered medications for this visit.     REVIEW OF SYSTEMS:   [X]  denotes positive finding, [ ]  denotes negative finding Cardiac  Comments:  Chest pain or chest pressure:    Shortness of breath upon exertion:    Short of breath when lying flat:    Irregular heart rhythm:    Constitutional    Fever or chills:      PHYSICAL EXAM:   Vitals:   09/20/16 1234  BP: (!) 94/58  Pulse: 76  Resp: 18  Temp: 98.2 F (36.8 C)  TempSrc: Oral  SpO2: 99%  Weight: 173 lb (78.5 kg)  Height: 5\' 3"  (1.6 m)    GENERAL: The patient is a well-nourished female, in no acute distress. The vital signs are documented above. CARDIOVASCULAR: There is a regular rate and rhythm. PULMONARY: Non-labored respirations fullness in both groin incisions, left greater than right, consistent with seroma  STUDIES:   I have reviewed her CT scan with the  following findings: 1. Interval aortobifem graft placement without apparent complication.  NON-VASCULAR  1. Large deep subcutaneous fluid collections overlie common femoral vessels, left greater than right, may represent postop seroma or hematoma. 2. Left nephrolithiasis without hydronephrosis.  MEDICAL ISSUES:   Status post aortobifemoral bypass graft: The patient's claudication symptoms have resolved.  She does have a large seroma in the left groin which is not causing her any difficulty.  I discussed treatment options of surgical evacuation, percutaneous aspiration, versus continued observation.  I feel it is in her best interest to continue to observe this and hopefully  it will resolve over time.  She will follow-up with me in 3 months.  Carotid stenosis: Status post carotid endarterectomy.  She will need surveillance in 6 months.  This will be done by Dr. Faythe Dingwall, MD [facility] [phone] Pager [phone]

## 2016-10-22 NOTE — Addendum Note (Signed)
Addendum  created 10/22/16 1300 by Breena Bevacqua, MD   Sign clinical note    

## 2016-10-25 NOTE — Addendum Note (Signed)
Addendum  created 10/25/16 1154 by Nehemie Casserly, MD   Sign clinical note    

## 2016-10-26 ENCOUNTER — Encounter: Payer: Self-pay | Admitting: Family

## 2016-10-26 ENCOUNTER — Telehealth: Payer: Self-pay | Admitting: *Deleted

## 2016-10-26 NOTE — Telephone Encounter (Signed)
Patient states that she is having redness that is warm to touch from her right ankle to her calf x 2 days. Patient is S/P AORTOBIFEMORAL BYPASS GRAFT using 36mm x 79mm hemashield graft. 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 She had called once before with swelling issues but she says that this does go down with elevation. The redness is a new problem, she is afebrile and has no ulcers, drainage or weeping blisters. Will bring her in to see our NP tomorrow for evaluation.

## 2016-10-27 ENCOUNTER — Ambulatory Visit (INDEPENDENT_AMBULATORY_CARE_PROVIDER_SITE_OTHER): Payer: Self-pay | Admitting: Family

## 2016-10-27 ENCOUNTER — Encounter: Payer: Self-pay | Admitting: Family

## 2016-10-27 VITALS — BP 101/68 | HR 68 | Temp 97.5°F | Resp 20 | Ht 63.0 in | Wt 178.0 lb

## 2016-10-27 DIAGNOSIS — F17200 Nicotine dependence, unspecified, uncomplicated: Secondary | ICD-10-CM

## 2016-10-27 DIAGNOSIS — L049 Acute lymphadenitis, unspecified: Secondary | ICD-10-CM

## 2016-10-27 DIAGNOSIS — Z9889 Other specified postprocedural states: Secondary | ICD-10-CM

## 2016-10-27 DIAGNOSIS — I7410 Embolism and thrombosis of unspecified parts of aorta: Secondary | ICD-10-CM

## 2016-10-27 DIAGNOSIS — I6521 Occlusion and stenosis of right carotid artery: Secondary | ICD-10-CM

## 2016-10-27 DIAGNOSIS — I700 Atherosclerosis of aorta: Secondary | ICD-10-CM

## 2016-10-27 MED ORDER — CEPHALEXIN 500 MG PO CAPS: 500.0000 mg | ORAL_CAPSULE | Freq: Three times a day (TID) | ORAL | 1 refills | Status: DC

## 2016-10-27 NOTE — Patient Instructions (Addendum)
Peripheral Vascular Disease Peripheral vascular disease (PVD) is a disease of the blood vessels that are not part of your heart and brain. A simple term for PVD is poor circulation. In most cases, PVD narrows the blood vessels that carry blood from your heart to the rest of your body. This can result in a decreased supply of blood to your arms, legs, and internal organs, like your stomach or kidneys. However, it most often affects a person's lower legs and feet. There are two types of PVD.  Organic PVD. This is the more common type. It is caused by damage to the structure of blood vessels.  Functional PVD. This is caused by conditions that make blood vessels contract and tighten (spasm).  Without treatment, PVD tends to get worse over time. PVD can also lead to acute ischemic limb. This is when an arm or limb suddenly has trouble getting enough blood. This is a medical emergency. Follow these instructions at home:  Take medicines only as told by your doctor.  Do not use any tobacco products, including cigarettes, chewing tobacco, or electronic cigarettes. If you need help quitting, ask your doctor.  Lose weight if you are overweight, and maintain a healthy weight as told by your doctor.  Eat a diet that is low in fat and cholesterol. If you need help, ask your doctor.  Exercise regularly. Ask your doctor for some good activities for you.  Take good care of your feet. ? Wear comfortable shoes that fit well. ? Check your feet often for any cuts or sores. Contact a doctor if:  You have cramps in your legs while walking.  You have leg pain when you are at rest.  You have coldness in a leg or foot.  Your skin changes.  You are unable to get or have an erection (erectile dysfunction).  You have cuts or sores on your feet that are not healing. Get help right away if:  Your arm or leg turns cold and blue.  Your arms or legs become red, warm, swollen, painful, or numb.  You have  chest pain or trouble breathing.  You suddenly have weakness in your face, arm, or leg.  You become very confused or you cannot speak.  You suddenly have a very bad headache.  You suddenly cannot see. This information is not intended to replace advice given to you by your health care provider. Make sure you discuss any questions you have with your health care provider. Document Released: 08/04/2009 Document Revised: 10/16/2015 Document Reviewed: 10/18/2013 Elsevier Interactive Patient Education  2017 Elsevier Inc.      Steps to Quit Smoking Smoking tobacco can be bad for your health. It can also affect almost every organ in your body. Smoking puts you and people around you at risk for many serious long-lasting (chronic) diseases. Quitting smoking is hard, but it is one of the best things that you can do for your health. It is never too late to quit. What are the benefits of quitting smoking? When you quit smoking, you lower your risk for getting serious diseases and conditions. They can include:  Lung cancer or lung disease.  Heart disease.  Stroke.  Heart attack.  Not being able to have children (infertility).  Weak bones (osteoporosis) and broken bones (fractures).  If you have coughing, wheezing, and shortness of breath, those symptoms may get better when you quit. You may also get sick less often. If you are pregnant, quitting smoking can help to lower   your chances of having a baby of low birth weight. What can I do to help me quit smoking? Talk with your doctor about what can help you quit smoking. Some things you can do (strategies) include:  Quitting smoking totally, instead of slowly cutting back how much you smoke over a period of time.  Going to in-person counseling. You are more likely to quit if you go to many counseling sessions.  Using resources and support systems, such as: ? Agricultural engineer with a Veterinary surgeon. ? Phone quitlines. ? Clinical biochemist. ? Support groups or group counseling. ? Text messaging programs. ? Mobile phone apps or applications.  Taking medicines. Some of these medicines may have nicotine in them. If you are pregnant or breastfeeding, do not take any medicines to quit smoking unless your doctor says it is okay. Talk with your doctor about counseling or other things that can help you.  Talk with your doctor about using more than one strategy at the same time, such as taking medicines while you are also going to in-person counseling. This can help make quitting easier. What things can I do to make it easier to quit? Quitting smoking might feel very hard at first, but there is a lot that you can do to make it easier. Take these steps:  Talk to your family and friends. Ask them to support and encourage you.  Call phone quitlines, reach out to support groups, or work with a Veterinary surgeon.  Ask people who smoke to not smoke around you.  Avoid places that make you want (trigger) to smoke, such as: ? Bars. ? Parties. ? Smoke-break areas at work.  Spend time with people who do not smoke.  Lower the stress in your life. Stress can make you want to smoke. Try these things to help your stress: ? Getting regular exercise. ? Deep-breathing exercises. ? Yoga. ? Meditating. ? Doing a body scan. To do this, close your eyes, focus on one area of your body at a time from head to toe, and notice which parts of your body are tense. Try to relax the muscles in those areas.  Download or buy apps on your mobile phone or tablet that can help you stick to your quit plan. There are many free apps, such as QuitGuide from the [employer] developer for Disease Control and Prevention). You can find more support from smokefree.gov and other websites.  This information is not intended to replace advice given to you by your health care provider. Make sure you discuss any questions you have with your health care provider. Document Released:  03/06/2009 Document Revised: 01/06/2016 Document Reviewed: 09/24/2014 Elsevier Interactive Patient Education  2018 [employer].   To measure for knee high compression hose: Measure the length of calf, largest circumference of calf, and ankle circumference first thing in the morning before your legs have a chance to swell.  Take these 3 measurements with you to obtain 20-30 mm mercury graduated knee high compression hose.  Put the stockings on in the morning, remove at bedtime.    Tod decrease swelling in legs: Elevate feet above slightly flexed knees, feet above heart, overnight and 3-4x/day for 20 minutes.

## 2016-10-27 NOTE — Progress Notes (Signed)
Postoperative Visit   History of Present Illness  Olivia [name] is a 56 y.o. year old female who is status post right carotid endarterectomy on 07/23/2016 by Dr. Myra Gianotti.  This was done for a high-grade right carotid stenosis which was asymptomatic.  Intraoperative findings included an 80% stenosis with a very high lesion.  Her postoperative course was uncomplicated.  The patient was having lifestyle limiting claudication at 50 feet.  A CT scan shows aortic graft occlusion.  Therefore on 08/18/2016, she underwent an aortobifemoral bypass graft using a 14 x 8 graft with reimplantation of the inferior mesenteric artery by Dr. Myra Gianotti.   Dr. Myra Gianotti last evaluated pt on 09-20-16. At that time -  Interval aortobifem graft placement without apparent complication. -  Large deep subcutaneous fluid collections overlie common femoral vessels, left greater than right, may represent postop seroma or hematoma.  - Left nephrolithiasis without hydronephrosis. Status post aortobifemoral bypass graft: The patient's claudication symptoms have resolved.  She does have a large seroma in the left groin which is not causing her any difficulty.  Dr. Myra Gianotti discussed treatment options of surgical evacuation, percutaneous aspiration, versus continued observation. Dr. Myra Gianotti felt it was in her best interest to continue to observe this and hopefully it will resolve over time.  She was to follow-up in 3 months. Carotid stenosis: Status post carotid endarterectomy.  She will need surveillance in 6 months.  This will be done by Dr. Allyson Sabal.  Pt returns today with c/o redness that is warm to touch from both ankles to her calves x 2 days. She works at night and stands/walks for the entire shift. She had called once before with swelling issues but she says that this does go down with elevation. The redness is a new problem, she is afebrile and has no ulcers, drainage or weeping blisters.  She has DM, her last A1C  result on file was 10.2 on 08-17-16, uncontrolled. She continues to smoke 1/2 ppd x 40 years.   The patient's wounds are healed.  The patient notes resolution of lower extremity symptoms.  The patient is able to complete their activities of daily living.     For VQI Use Only  PRE-ADM LIVING: Home  AMB STATUS: Ambulatory   Past Medical History:  Diagnosis Date  . Arthritis    back  . 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 Coronary artery disease   . Diabetes mellitus without complication (HCC)    Type II  . Hypertension   . Hypothyroidism   . Myocardial infarction (HCC) 2012  . PONV (postoperative nausea and vomiting)     Past Surgical History:  Procedure Laterality Date  . ABDOMINAL HYSTERECTOMY  2003   partial  . AORTA - BILATERAL FEMORAL ARTERY BYPASS GRAFT Bilateral 08/18/2016   Procedure: AORTOBIFEMORAL BYPASS GRAFT using 14mm x 7mm hemashield graft;  Surgeon: Nada Libman, MD;  Location: [facility] OR;  Service: Vascular;  Laterality: Bilateral;  . CARDIAC CATHETERIZATION  2012   DES pCX 02/25/11  . CESAREAN SECTION    . CHOLECYSTECTOMY  1995  . ENDARTERECTOMY Right 07/23/2016   Procedure: RIGHT CAROTID ENDARTERECTOMY;  Surgeon: Nada Libman, MD;  Location: [facility] OR;  Service: Vascular;  Laterality: Right;  . laser back surgery  07/01/2015  . LUMBAR LAMINECTOMY/DECOMPRESSION MICRODISCECTOMY Right 06/28/2014   Procedure: LUMBAR LAMINECTOMY/DECOMPRESSION MICRODISCECTOMY 1 LEVEL;  Surgeon: Coletta Memos, MD;  Location: [facility] NEURO ORS;  Service: Neurosurgery;  Laterality: Right;  Right L5S1 far lateral microdiskectomy  . PATCH ANGIOPLASTY Right 07/23/2016   Procedure: PATCH ANGIOPLASTY USING Livia Snellen VASCULAR PATCH;  Surgeon: Nada Libman, MD;  Location[facility] OR;  Service: Vascular;  Laterality: Right;    Social History   Social History  . Marital status: Married    Spouse name: N/A  . Number of children: N/A  .  Years of education: N/A   Occupational History  . Not on file.   Social History Main Topics  . Smoking status: Current Every Day Smoker    Packs/day: 0.50    Years: 40.00    Types: Cigarettes    Start date: 01/03/1976  . Smokeless tobacco: Never Used  . Alcohol use No  . Drug use: No  . Sexual activity: Not Currently    Birth control/ protection: Surgical   Other Topics Concern  . Not on file   Social History Narrative  . No narrative on file    Allergies  Allergen Reactions  . No Known Allergies     Current Outpatient Prescriptions on File Prior to Visit  Medication Sig Dispense Refill  . aspirin EC 81 MG tablet Take 81 mg by mouth every evening.     . Aspirin-Acetaminophen-Caffeine (GOODY HEADACHE PO) Take 1 packet by mouth daily as needed (headaches).    . gabapentin (NEURONTIN) 400 MG capsule Take 400 mg by mouth 3 (three) times daily.    Marland Kitchen glipiZIDE (GLUCOTROL) 10 MG tablet Take 10 mg by mouth daily before breakfast.    . JARDIANCE 25 MG TABS tablet Take 25 mg by mouth every evening.     Marland Kitchen levothyroxine (SYNTHROID, LEVOTHROID) 25 MCG tablet Take 25 mcg by mouth daily before breakfast.    . lisinopril (PRINIVIL,ZESTRIL) 5 MG tablet Take 1 tablet (5 mg total) by mouth daily.    . metFORMIN (GLUCOPHAGE) 1000 MG tablet Take 1,000 mg by mouth 2 (two) times daily with a meal.    . metoprolol (LOPRESSOR) 50 MG tablet TAKE ONE TABLET BY MOUTH TWICE DAILY 180 tablet 3  . nicotine (NICODERM CQ - DOSED IN MG/24 HOURS) 21 mg/24hr patch Place 1 patch (21 mg total) onto the skin daily. 28 patch 1  . nitroGLYCERIN (NITROSTAT) 0.4 MG SL tablet Place 1 tablet (0.4 mg total) under the tongue every 5 (five) minutes as needed for chest pain. 25 tablet 3  . pravastatin (PRAVACHOL) 40 MG tablet Take 40 mg by mouth at bedtime.     . vitamin E 400 UNIT capsule Take 400 Units by mouth daily.    Marland Kitchen oxyCODONE-acetaminophen (ROXICET) 5-325 MG tablet Take 1 tablet by mouth every 6 (six) hours as  needed for severe pain. (Patient not taking: Reported on 10/27/2016) 30 tablet 0   No current facility-administered medications on file prior to visit.      Physical Examination  Vitals:   10/27/16 0913  BP: 101/68  Pulse: 68  Resp: 20  Temp: 97.5 F (36.4 C)  TempSrc: Oral  SpO2: 100%  Weight: 178 lb (80.7 kg)  Height: 5\' 3"  (1.6 m)   Body mass index is 31.53 kg/m.  Audible Doppler signals at right radial and ulnar pulses (not palpable), and bilateral DP and PT arteries.  Bilateral femoral pulses are palpable. Seroma left side of pubis is resolving. Mid line abdominal and groin incisions are healed.   Medical Decision Making  Olivia [name] is a 57 y.o. year old female who presents s/p aortobifemoral  bypass graft using a 14 x 8 graft with reimplantation of the inferior mesenteric artery on 08/18/2016.  Dr. Edilia Bo spoke with and examined pt. Likely lymphadenitis of both lower legs after on her feet for 12 hours: Keflex 500 mg po tid x 10 days with 1 refill if not improved. The patient was instructed how to elevate feet above slightly flexed knees, feet above heart, overnight and 3-4x/day for 20 minutes. Knee high compression hose during the day, see pt instructions.  The patient was counseled re smoking cessation and given several free resources re smoking cessation.   Return in July as already scheduled. I advised pt to notify us if her symptoms worsen.    The patient's bypass incisions have healed appropriately with resolution of pre-operative symptoms. I discussed in depth with the patient the nature of atherosclerosis, and emphasized the importance of maximal medical management including strict control of blood pressure, blood glucose, and lipid levels, obtaining regular exercise, and cessation of smoking.  The patient is aware that without maximal medical management the underlying atherosclerotic disease process will progress, limiting the benefit of any interventions.      I emphasized the importance of routine surveillance of the patient's bypass, as the vascular surgery literature emphasize the improved patency possible with assisted primary patency procedures versus secondary patency procedures. The patient agrees to participate in their maximal medical care and routine surveillance.  Thank you for allowing [place] to participate in this patient's care.  Cyara Devoto, Carma Lair, RN, MSN, FNP-C [facility] [place] Office: [phone]  10/27/2016, 9:20 AM  Clinic MD: Edilia Bo

## 2016-12-10 ENCOUNTER — Encounter: Payer: Self-pay | Admitting: Surgery

## 2016-12-20 ENCOUNTER — Encounter: Payer: Self-pay | Admitting: Surgery

## 2016-12-20 ENCOUNTER — Ambulatory Visit (INDEPENDENT_AMBULATORY_CARE_PROVIDER_SITE_OTHER): Payer: BLUE CROSS/BLUE SHIELD | Admitting: Surgery

## 2016-12-20 VITALS — BP 116/75 | HR 70 | Temp 97.2°F | Resp 16 | Ht 63.0 in | Wt 180.0 lb

## 2016-12-20 DIAGNOSIS — I700 Atherosclerosis of aorta: Secondary | ICD-10-CM

## 2016-12-20 DIAGNOSIS — I7410 Embolism and thrombosis of unspecified parts of aorta: Secondary | ICD-10-CM | POA: Diagnosis not present

## 2016-12-20 NOTE — Progress Notes (Signed)
[facility] [place]  Patient name: Olivia [name] MRN: [id number] DOB: 06-27-1960 Sex: female   REASON FOR VISIT:    Follow up  HISOTRY OF PRESENT ILLNESS:    Olivia [name] is a 56 y.o. female is well known to me.  She is status post right carotid endarterectomy on 07/23/2016.  This was done for a high-grade asymptomatic stenosis.  Intraoperative findings included an 80% stenosis which was very high.  The patient also is status post an aortobifemoral bypass graft using a 14 x 8 graft with reimplantation of the inferior mesenteric artery.  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  She was recently seen in the office and found to have lymphangitis.  She was given Keflex.  The inflammatory/infectious component has resolved, however she is continuing to have bilateral swelling.  She is wearing compression that she got from [place].  Her claudication symptoms have resolved.   PAST MEDICAL HISTORY:   Past Medical History:  Diagnosis Date  . Arthritis    back  . 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 Coronary artery disease   . Diabetes mellitus without complication (HCC)    Type II  . Hypertension   . Hypothyroidism   . Myocardial infarction (HCC) 2012  . PONV (postoperative nausea and vomiting)      FAMILY HISTORY:   Family History  Problem Relation Age of Onset  . Diabetes Mother   . Diabetes Father   . Diabetes Brother   . Diabetes Brother     SOCIAL HISTORY:   Social History  Substance Use Topics  . Smoking status: Current Every Day Smoker    Packs/day: 1.00    Years: 40.00    Types: Cigarettes    Start date: 01/03/1976  . Smokeless tobacco: Never Used  . Alcohol use No     ALLERGIES:   Allergies  Allergen Reactions  . No Known Allergies      CURRENT MEDICATIONS:   Current Outpatient Prescriptions    Medication Sig Dispense Refill  . cyanocobalamin 1000 MCG tablet Take 1,000 mcg by mouth daily.    . Pyridoxine HCl (VITAMIN B-6) 500 MG tablet Take 500 mg by mouth daily.    Marland Kitchen aspirin EC 81 MG tablet Take 81 mg by mouth every evening.     . Aspirin-Acetaminophen-Caffeine (GOODY HEADACHE PO) Take 1 packet by mouth daily as needed (headaches).    . cephALEXin (KEFLEX) 500 MG capsule Take 1 capsule (500 mg total) by mouth 3 (three) times daily. 30 capsule 1  . gabapentin (NEURONTIN) 400 MG capsule Take 400 mg by mouth 3 (three) times daily.    Marland Kitchen glipiZIDE (GLUCOTROL) 10 MG tablet Take 10 mg by mouth daily before breakfast.    . JARDIANCE 25 MG TABS tablet Take 25 mg by mouth every evening.     Marland Kitchen levothyroxine (SYNTHROID, LEVOTHROID) 25 MCG tablet Take 25 mcg by mouth daily before breakfast.    . lisinopril (PRINIVIL,ZESTRIL) 5 MG tablet Take 1 tablet (5 mg total) by mouth daily.    . metFORMIN (GLUCOPHAGE) 1000 MG tablet Take 1,000 mg by mouth 2 (two) times daily with a meal.    . metoprolol (LOPRESSOR) 50 MG tablet TAKE ONE TABLET BY MOUTH TWICE DAILY 180 tablet 3  . nicotine (NICODERM CQ - DOSED IN MG/24 HOURS) 21 mg/24hr patch Place 1 patch (21 mg total)  onto the skin daily. 28 patch 1  . nitroGLYCERIN (NITROSTAT) 0.4 MG SL tablet Place 1 tablet (0.4 mg total) under the tongue every 5 (five) minutes as needed for chest pain. 25 tablet 3  . pravastatin (PRAVACHOL) 40 MG tablet Take 40 mg by mouth at bedtime.     . vitamin E 400 UNIT capsule Take 400 Units by mouth daily.     No current facility-administered medications for this visit.     REVIEW OF SYSTEMS:   [X]  denotes positive finding, [ ]  denotes negative finding Cardiac  Comments:  Chest pain or chest pressure:    Shortness of breath upon exertion:    Short of breath when lying flat:    Irregular heart rhythm:        Vascular    Pain in calf, thigh, or hip brought on by ambulation:    Pain in feet at night that wakes you up  from your sleep:     Blood clot in your veins:    Leg swelling:  x       Pulmonary    Oxygen at home:    Productive cough:     Wheezing:         Neurologic    Sudden weakness in arms or legs:     Sudden numbness in arms or legs:     Sudden onset of difficulty speaking or slurred speech:    Temporary loss of vision in one eye:     Problems with dizziness:         Gastrointestinal    Blood in stool:     Vomited blood:         Genitourinary    Burning when urinating:     Blood in urine:        Psychiatric    Major depression:         Hematologic    Bleeding problems:    Problems with blood clotting too easily:        Skin    Rashes or ulcers:        Constitutional    Fever or chills:      PHYSICAL EXAM:   Vitals:   12/20/16 0915  BP: 116/75  Pulse: 70  Resp: 16  Temp: (!) 97.2 F (36.2 C)  TempSrc: Oral  SpO2: 96%  Weight: 180 lb (81.6 kg)  Height: 5\' 3"  (1.6 m)    GENERAL: The patient is a well-nourished female, in no acute distress. The vital signs are documented above. CARDIAC: There is a regular rate and rhythm.  VASCULAR: Persistent seroma in the left groin which has decreased in size.  Bilateral lower extremity edema. PULMONARY: Non-labored respirations ABDOMEN: Soft and non-tender midline incision healed without hernia MUSCULOSKELETAL: There are no major deformities or cyanosis. NEUROLOGIC: No focal weakness or paresthesias are detected. SKIN: There are no ulcers or rashes noted. PSYCHIATRIC: The patient has a normal affect.  STUDIES:   None  MEDICAL ISSUES:   I have requested that the patient go to a lasting therapies to get 20-30 thigh-high compression stockings and wear these when she gets up until when she goes to bed.  She has a persistent seroma and the left groin.  The right side has resolved.  The left does appear to be smaller.  I suspect this is why she hasn't completely resolved her lower extremity edema issues.  I would like for her  to wear the appropriate thigh-high compression stockings and then follow-up  with me in 3 months.  At that time I will get a CT scan to evaluate the size of her thrombus and to determine whether or not any surgical intervention is    Durene Cal, MD [facility] [phone] Pager [phone]

## 2017-03-14 ENCOUNTER — Other Ambulatory Visit: Payer: Self-pay | Admitting: *Deleted

## 2017-03-14 DIAGNOSIS — Z9889 Other specified postprocedural states: Secondary | ICD-10-CM

## 2017-03-28 ENCOUNTER — Other Ambulatory Visit: Payer: Self-pay

## 2017-03-28 ENCOUNTER — Ambulatory Visit: Payer: BLUE CROSS/BLUE SHIELD | Admitting: Surgery

## 2017-03-28 DIAGNOSIS — I7409 Other arterial embolism and thrombosis of abdominal aorta: Secondary | ICD-10-CM

## 2017-03-28 DIAGNOSIS — I739 Peripheral vascular disease, unspecified: Secondary | ICD-10-CM

## 2017-04-05 ENCOUNTER — Ambulatory Visit (HOSPITAL_COMMUNITY)
Admission: RE | Admit: 2017-04-05 | Discharge: 2017-04-05 | Disposition: A | Discharge status: Home / Self Care | Payer: BLUE CROSS/BLUE SHIELD | Source: Ambulatory Visit | Attending: Surgery | Admitting: Surgery

## 2017-04-05 DIAGNOSIS — N200 Calculus of kidney: Secondary | ICD-10-CM | POA: Insufficient documentation

## 2017-04-05 DIAGNOSIS — I771 Stricture of artery: Secondary | ICD-10-CM | POA: Diagnosis not present

## 2017-04-05 DIAGNOSIS — I7409 Other arterial embolism and thrombosis of abdominal aorta: Secondary | ICD-10-CM

## 2017-04-05 DIAGNOSIS — Z951 Presence of aortocoronary bypass graft: Secondary | ICD-10-CM | POA: Diagnosis not present

## 2017-04-05 DIAGNOSIS — Z9889 Other specified postprocedural states: Secondary | ICD-10-CM | POA: Diagnosis not present

## 2017-04-05 DIAGNOSIS — I739 Peripheral vascular disease, unspecified: Secondary | ICD-10-CM

## 2017-04-05 LAB — POCT I-STAT CREATININE: CREATININE: 0.7 mg/dL (ref 0.44–1.00)

## 2017-04-05 MED ORDER — IOPAMIDOL (ISOVUE-300) INJECTION 61%
150.0000 mL | Freq: Once | INTRAVENOUS | Status: AC | PRN
  Administered 2017-04-05: 150 mL via INTRAVENOUS

## 2017-04-20 ENCOUNTER — Ambulatory Visit: Payer: BLUE CROSS/BLUE SHIELD | Admitting: Surgery

## 2017-04-20 ENCOUNTER — Encounter: Payer: Self-pay | Admitting: Surgery

## 2017-04-20 VITALS — BP 179/92 | HR 83 | Resp 20 | Ht 63.0 in | Wt 184.0 lb

## 2017-04-20 DIAGNOSIS — I700 Atherosclerosis of aorta: Secondary | ICD-10-CM

## 2017-04-20 DIAGNOSIS — I7410 Embolism and thrombosis of unspecified parts of aorta: Secondary | ICD-10-CM | POA: Diagnosis not present

## 2017-04-20 NOTE — Progress Notes (Signed)
[facility] [place]  Patient name: Olivia [name] MRN: [id number] DOB: 02/19/1961 Sex: female   REASON FOR VISIT:    Follow up  HISOTRY OF PRESENT ILLNESS:    Olivia Rancheresa C Calef is a 56 y.o. female is well known to me.  She is status post right carotid endarterectomy on 07/23/2016.  This was done for a high-grade asymptomatic stenosis.  Intraoperative findings included an 80% stenosis which was very high.  The patient also is status post an aortobifemoral bypass graft using a 14 x 8 graft with reimplantation of the inferior mesenteric artery.  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  She states that if she walks very long distance that she will get pain in her calf.  She does not have any rest pain.  Her walking ability is significantly improved.  She does not have any open wounds on her feet.  She feels like her left groin seroma is getting smaller.  She is wearing compression stockings for her leg swelling  PAST MEDICAL HISTORY:   Past Medical History:  Diagnosis Date  . Arthritis    back  . 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 Coronary artery disease   . Diabetes mellitus without complication (HCC)    Type II  . Hypertension   . Hypothyroidism   . Myocardial infarction (HCC) 2012  . PONV (postoperative nausea and vomiting)      FAMILY HISTORY:   Family History  Problem Relation Age of Onset  . Diabetes Mother   . Diabetes Father   . Diabetes Brother   . Diabetes Brother     SOCIAL HISTORY:   Social History   Tobacco Use  . Smoking status: Current Every Day Smoker    Packs/day: 1.00    Years: 40.00    Pack years: 40.00    Types: Cigarettes    Start date: 01/03/1976  . Smokeless tobacco: Never Used  Substance Use Topics  . Alcohol use: No    Alcohol/week:  0.0 oz     ALLERGIES:   Allergies  Allergen Reactions  . No Known Allergies      CURRENT MEDICATIONS:   Current Outpatient Medications  Medication Sig Dispense Refill  . aspirin EC 81 MG tablet Take 81 mg by mouth every evening.     . Aspirin-Acetaminophen-Caffeine (GOODY HEADACHE PO) Take 1 packet by mouth daily as needed (headaches).    . cephALEXin (KEFLEX) 500 MG capsule Take 1 capsule (500 mg total) by mouth 3 (three) times daily. 30 capsule 1  . cyanocobalamin 1000 MCG tablet Take 1,000 mcg by mouth daily.    Marland Kitchen. gabapentin (NEURONTIN) 400 MG capsule Take 400 mg by mouth 3 (three) times daily.    Marland Kitchen. glipiZIDE (GLUCOTROL) 10 MG tablet Take 10 mg by mouth daily before breakfast.    . JARDIANCE 25 MG TABS tablet Take 25 mg by mouth every evening.     Marland Kitchen. levothyroxine (SYNTHROID, LEVOTHROID) 25 MCG tablet Take 25 mcg by mouth daily before breakfast.    . lisinopril (PRINIVIL,ZESTRIL) 5 MG tablet Take 1 tablet (5 mg total) by mouth daily.    . metFORMIN (GLUCOPHAGE) 1000 MG tablet Take 1,000 mg by mouth 2 (two) times daily with a meal.    . metoprolol (LOPRESSOR) 50 MG tablet TAKE ONE TABLET  BY MOUTH TWICE DAILY 180 tablet 3  . nicotine (NICODERM CQ - DOSED IN MG/24 HOURS) 21 mg/24hr patch Place 1 patch (21 mg total) onto the skin daily. 28 patch 1  . nitroGLYCERIN (NITROSTAT) 0.4 MG SL tablet Place 1 tablet (0.4 mg total) under the tongue every 5 (five) minutes as needed for chest pain. 25 tablet 3  . pravastatin (PRAVACHOL) 40 MG tablet Take 40 mg by mouth at bedtime.     . Pyridoxine HCl (VITAMIN B-6) 500 MG tablet Take 500 mg by mouth daily.    . vitamin E 400 UNIT capsule Take 400 Units by mouth daily.     No current facility-administered medications for this visit.     REVIEW OF SYSTEMS:   [X]  denotes positive finding, [ ]  denotes negative finding Cardiac  Comments:  Chest pain or chest pressure:    Shortness of breath upon exertion:    Short of breath when lying flat:      Irregular heart rhythm:        Vascular    Pain in calf, thigh, or hip brought on by ambulation:    Pain in feet at night that wakes you up from your sleep:     Blood clot in your veins:    Leg swelling:  x       Pulmonary    Oxygen at home:    Productive cough:     Wheezing:         Neurologic    Sudden weakness in arms or legs:     Sudden numbness in arms or legs:     Sudden onset of difficulty speaking or slurred speech:    Temporary loss of vision in one eye:     Problems with dizziness:         Gastrointestinal    Blood in stool:     Vomited blood:         Genitourinary    Burning when urinating:     Blood in urine:        Psychiatric    Major depression:         Hematologic    Bleeding problems:    Problems with blood clotting too easily:        Skin    Rashes or ulcers:        Constitutional    Fever or chills:      PHYSICAL EXAM:   Vitals:   04/20/17 1546 04/20/17 1547  BP: (!) 181/97 (!) 179/92  Pulse: 83   Resp: 20   SpO2: 99%   Weight: 184 lb (83.5 kg)   Height: 5\' 3"  (1.6 m)     GENERAL: The patient is a well-nourished female, in no acute distress. The vital signs are documented above. CARDIAC: There is a regular rate and rhythm.  VASCULAR: Palpable dorsalis pedis pulse bilaterally PULMONARY: Non-labored respirations ABDOMEN: Soft and non-tender midline incision is well-healed.  Soft left femoral seroma MUSCULOSKELETAL: There are no major deformities or cyanosis. NEUROLOGIC: No focal weakness or paresthesias are detected. SKIN: There are no ulcers or rashes noted. PSYCHIATRIC: The patient has a normal affect.  STUDIES:   I have reviewed the CT scan with the following findings: Narrowing at the proximal anastomosis of the aortobifemoral bypass graft, just below the renal artery takeoff has increased and is now 60%.  The graft and femoral limbs of the graft are patent  Bilateral common femoral arteries and distal anastomoses are  patent.  There is now significant narrowing at the origins of the right profunda femoral and superficial femoral arteries. This has progressed since the prior study. Right lower extremity arterial system is thereafter patent with 3 vessel runoff.  No significant narrowing in the left femoral or popliteal system with 3 vessel runoff.  NON-VASCULAR  Right inguinal fluid collection resolved. Left inguinal fluid collection is smaller. These likely represent postoperative seromas.  Left nephrolithiasis.   MEDICAL ISSUES:   Overall the patient is doing very well.  She has palpable pedal pulses and no significant symptoms at this time.  However scheduled for follow-up in 6 months.  She is not interested in surgical intervention for her left femoral seroma.  It has gotten smaller.  I think it is acceptable to continue to monitor this.  I'll see her back in 6 months.    Durene Cal, MD [facility] [phone] Pager [phone]

## 2017-10-31 ENCOUNTER — Ambulatory Visit: Payer: BLUE CROSS/BLUE SHIELD | Admitting: Surgery

## 2017-12-05 IMAGING — CT CT ANGIO AOBIFEM WO/W CM
2 of 18 series · 12 of 48 positions shown, 17 images · IV contrast (isovue)
Comparison: 07/13/2016

CLINICAL DATA: Hx of claudication. RT leg pain and numbness. Rt
Carotid artery stenosis. Pre op for endarterectomy.

EXAM:
CT ANGIOGRAPHY OF ABDOMINAL AORTA WITH ILIOFEMORAL RUNOFF
TECHNIQUE: Multidetector CT imaging of the abdomen, pelvis and lower
extremities was performed using the standard protocol during bolus
administration of intravenous contrast. Multiplanar CT image
reconstructions and MIPs were obtained to evaluate the vascular
anatomy.
CONTRAST:  125 ml Isovue 370 given. BUN 20 CRE

[Series 6: knee to toe 3.0 i40f 3 · axial · 0.67mm/px · z∈[-1222,-1028]mm · 4 of 173 slices shown]
[im 22/173  soft-tissue]
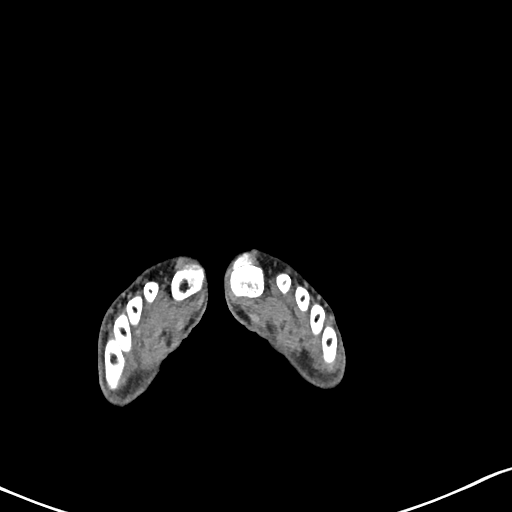
[im 44/173  soft-tissue]
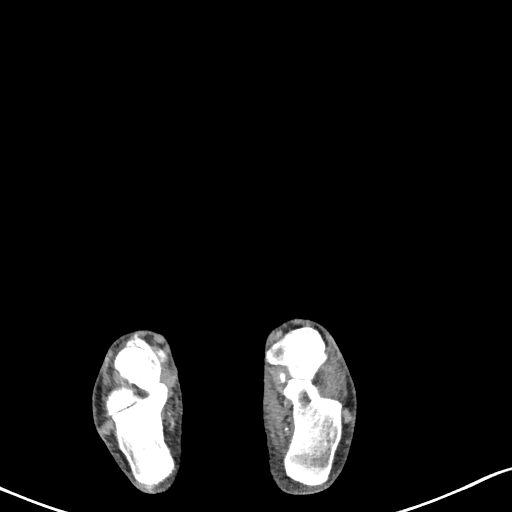
[im 65/173  soft-tissue]
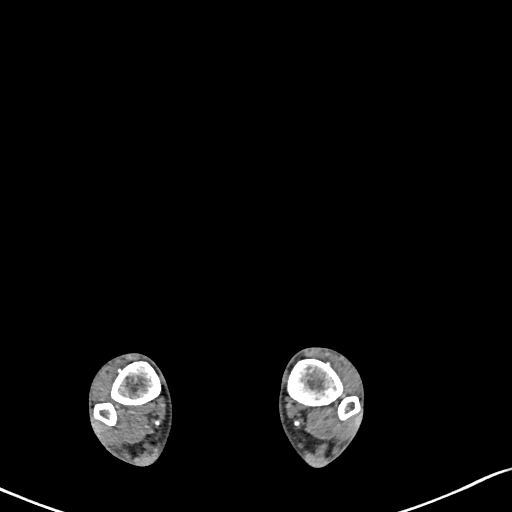
[im 87/173  soft-tissue]
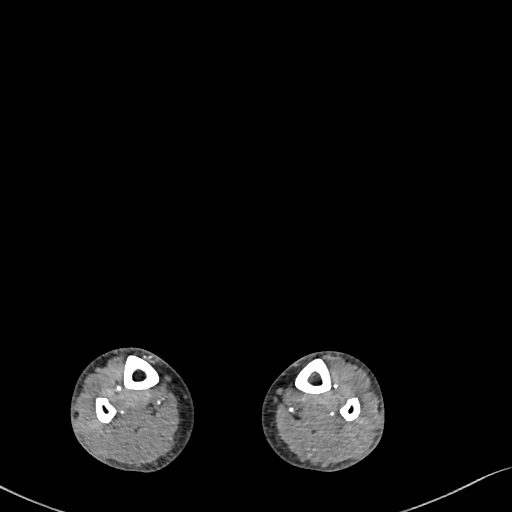

[Series 22: angiorunoff 3.0 low axials · axial · 0.88mm/px · z∈[-786,-339]mm · 8 of 193 slices shown, 13 images]
[im 22/193  soft-tissue]
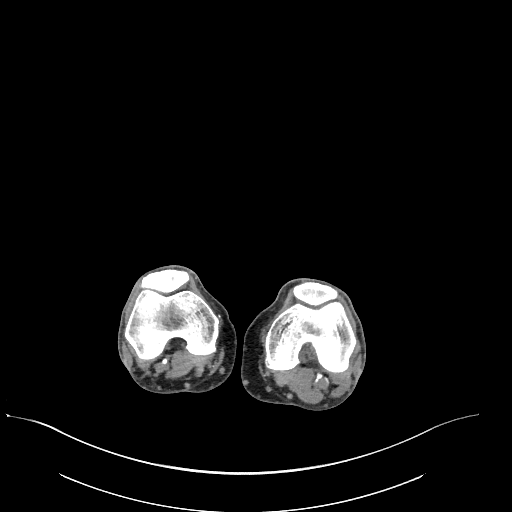
[im 22/193  bone]
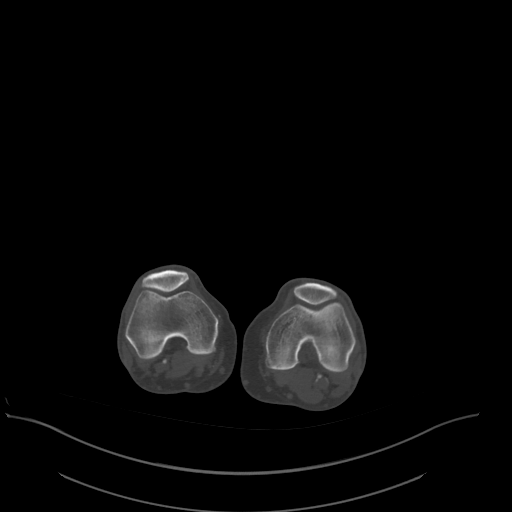
[im 43/193  soft-tissue]
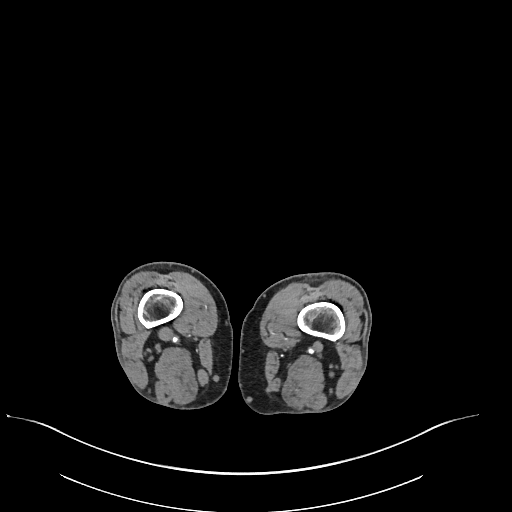
[im 65/193  soft-tissue]
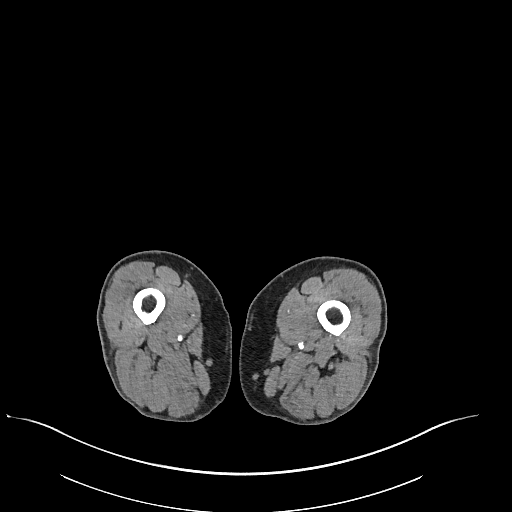
[im 86/193  soft-tissue]
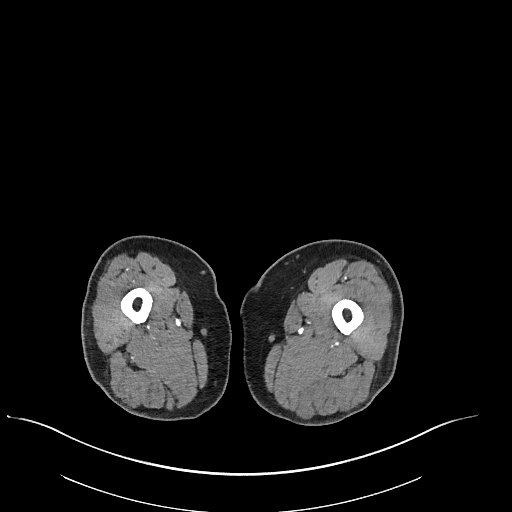
[im 107/193  soft-tissue]
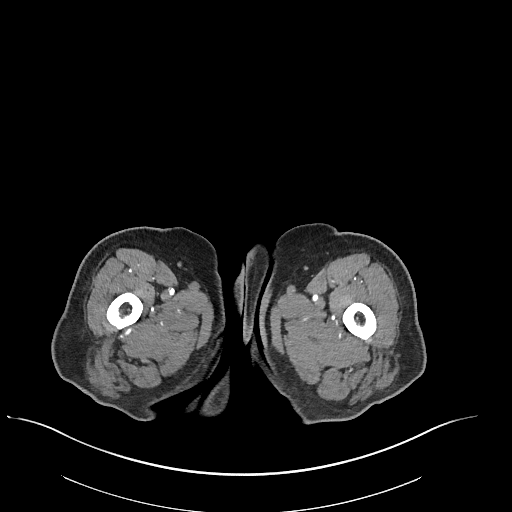
[im 107/193  lung]
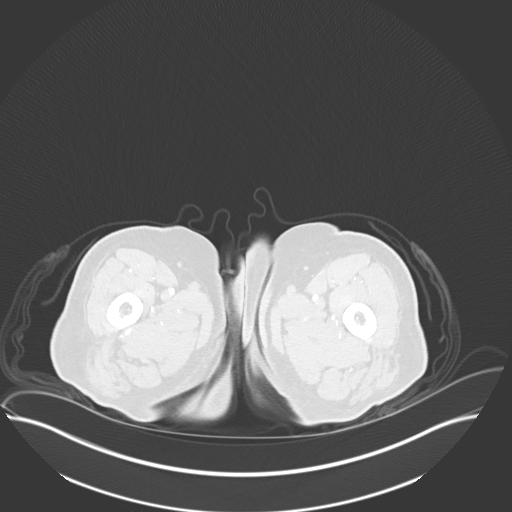
[im 129/193  soft-tissue]
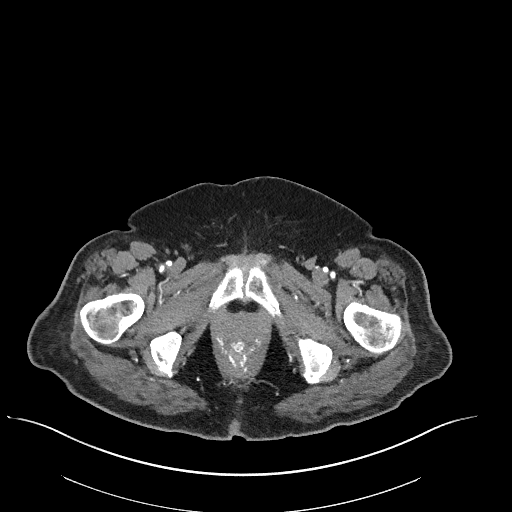
[im 129/193  lung]
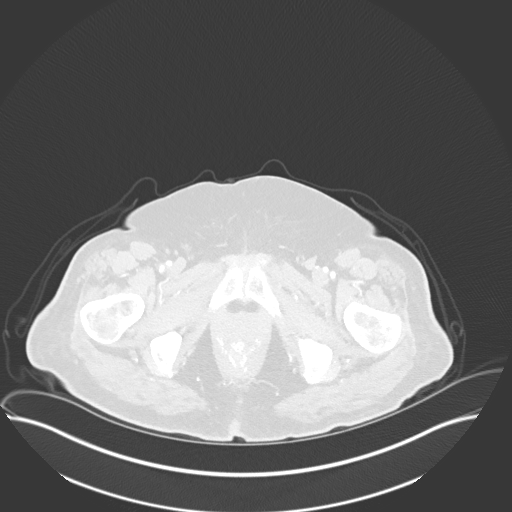
[im 150/193  soft-tissue]
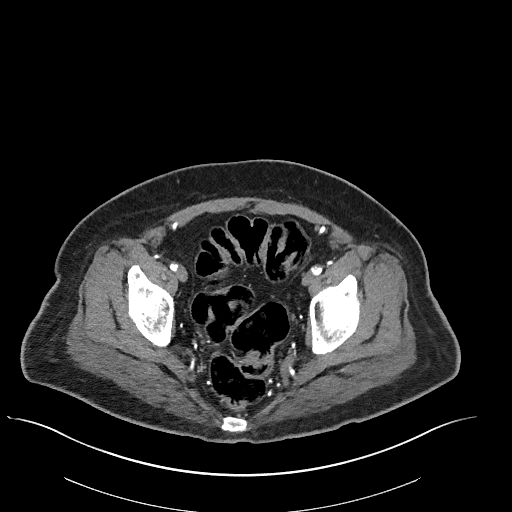
[im 150/193  lung]
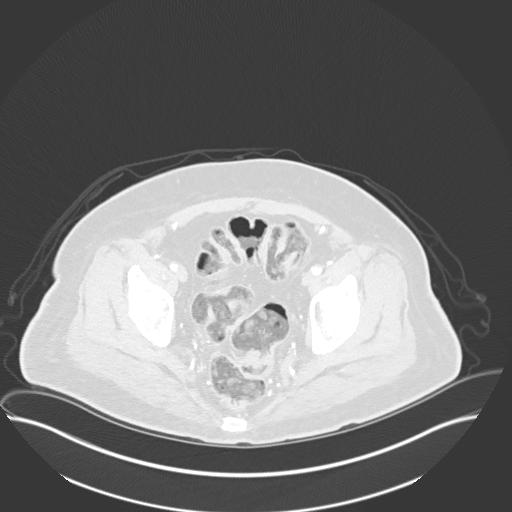
[im 171/193  soft-tissue]
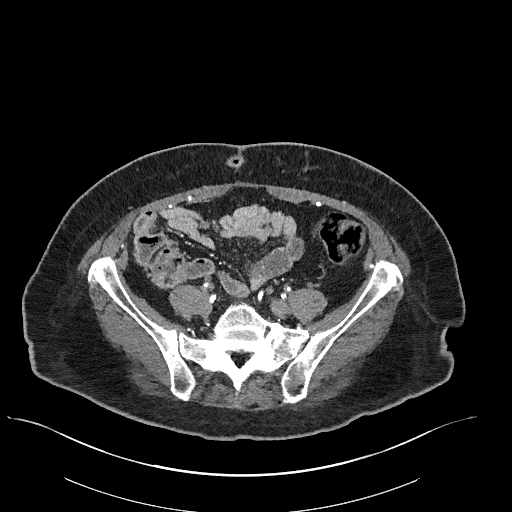
[im 171/193  lung]
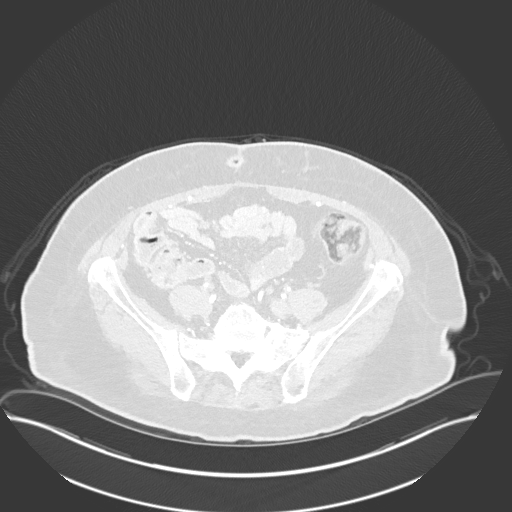

[12 of 48 positions shown; findings below may reference images not displayed]

FINDINGS: VASCULAR

Aorta: Mild atheromatous plaque in the visualized distal descending
thoracic and suprarenal segments. Heavier infrarenal plaque with
occlusion just below the IMA origin. No aneurysm.

Celiac: Patent without evidence of aneurysm, dissection, vasculitis
or significant stenosis.

SMA: Patent without evidence of aneurysm, dissection, vasculitis or
significant stenosis.

Renals: Beaded appearance of the single left renal artery through
its length suggesting FMD. There is a similar subtle appearance in
the distal right main renal artery and its dominant inferior
division.

IMA: Origin stenosis related to aortic wall plaque, patent distally

Lower extremities

Inflow: Long segment occlusion of bilateral common iliac arteries.
Collateral reconstitution of atheromatous diminutive external iliac
arteries bilaterally.

Outflow: Scattered eccentric partially calcified plaque in bilateral
common femoral and superficial femoral arteries without high-grade
stenosis or occlusion. Popliteal arteries patent.

Runoff: Three-vessel tibial runoff bilaterally.

Review of the MIP images confirms the above findings.

NON-VASCULAR

Lower chest: Multiple ground-glass pulmonary nodules in both lung
bases, unchanged from earlier study.

Hepatobiliary: No focal liver abnormality is seen. Status post
cholecystectomy. No biliary dilatation.

Pancreas: Unremarkable. No pancreatic ductal dilatation or
surrounding inflammatory changes.

Spleen: Normal in size without focal abnormality.

Adrenals/Urinary Tract: 5 mm calculus, lower pole left renal
collecting system. No hydronephrosis. Urinary bladder incompletely
distended. No focal renal mass. Normal adrenals.

Stomach/Bowel: Stomach, small bowel, and colon are nondistended.
Appendix not discretely identified.

Lymphatic: No adenopathy.

Reproductive: Status post hysterectomy. No adnexal masses.

Other: No ascites.  No free air.

Musculoskeletal: Mild spondylitic changes in the visualized lower
thoracic and lumbar spine. No acute fracture or worrisome bone
lesion.
IMPRESSION: VASCULAR

1. Infrarenal aortic occlusion extending through the length of
bilateral common iliac arteries.
2. Collateral reconstitution of bilateral lower extremity outflow,
with bilateral three-vessel tibial runoff.
3. Abnormal appearance of bilateral renal arteries suggesting
fibromuscular dysplasia (FMD).

NON-VASCULAR

1. Bilateral lower lobe pulmonary nodules as seen on previous study
2 days ago. Non-contrast chest CT at 3-6 months is recommended. If
nodules persist, subsequent management will be based upon the most
suspicious nodule(s). This recommendation follows the consensus
statement: Guidelines for Management of Incidental Pulmonary Nodules
Detected on CT Images:From the [HOSPITAL] 7997; published
online before print (10.1148/radiol.[id number]).
2. Left nephrolithiasis without hydronephrosis.

## 2018-04-24 ENCOUNTER — Ambulatory Visit: Payer: BLUE CROSS/BLUE SHIELD | Admitting: Surgery

## 2019-08-12 ENCOUNTER — Other Ambulatory Visit: Payer: Self-pay

## 2019-08-12 ENCOUNTER — Inpatient Hospital Stay (HOSPITAL_COMMUNITY)
Admission: EM | Admit: 2019-08-12 | Discharge: 2019-08-17 | DRG: 580 | Disposition: A | Discharge status: Home Health | Payer: BC Managed Care – PPO | Attending: Vascular Surgery | Admitting: Vascular Surgery

## 2019-08-12 ENCOUNTER — Encounter (HOSPITAL_COMMUNITY): Payer: Self-pay | Admitting: Emergency Medicine

## 2019-08-12 ENCOUNTER — Emergency Department (HOSPITAL_COMMUNITY): Payer: BC Managed Care – PPO

## 2019-08-12 ENCOUNTER — Emergency Department (HOSPITAL_COMMUNITY): Payer: BC Managed Care – PPO | Admitting: Anesthesiology

## 2019-08-12 ENCOUNTER — Encounter (HOSPITAL_COMMUNITY)
Admission: EM | Disposition: A | Discharge status: Home Health | Payer: Self-pay | Source: Home / Self Care | Attending: Vascular Surgery

## 2019-08-12 DIAGNOSIS — F17210 Nicotine dependence, cigarettes, uncomplicated: Secondary | ICD-10-CM | POA: Diagnosis present

## 2019-08-12 DIAGNOSIS — Z20822 Contact with and (suspected) exposure to covid-19: Secondary | ICD-10-CM | POA: Diagnosis present

## 2019-08-12 DIAGNOSIS — E669 Obesity, unspecified: Secondary | ICD-10-CM | POA: Diagnosis present

## 2019-08-12 DIAGNOSIS — E1165 Type 2 diabetes mellitus with hyperglycemia: Secondary | ICD-10-CM | POA: Diagnosis present

## 2019-08-12 DIAGNOSIS — L0390 Cellulitis, unspecified: Secondary | ICD-10-CM | POA: Diagnosis present

## 2019-08-12 DIAGNOSIS — B9561 Methicillin susceptible Staphylococcus aureus infection as the cause of diseases classified elsewhere: Secondary | ICD-10-CM | POA: Diagnosis present

## 2019-08-12 DIAGNOSIS — I252 Old myocardial infarction: Secondary | ICD-10-CM

## 2019-08-12 DIAGNOSIS — R630 Anorexia: Secondary | ICD-10-CM | POA: Diagnosis present

## 2019-08-12 DIAGNOSIS — R5381 Other malaise: Secondary | ICD-10-CM | POA: Diagnosis present

## 2019-08-12 DIAGNOSIS — Z79899 Other long term (current) drug therapy: Secondary | ICD-10-CM

## 2019-08-12 DIAGNOSIS — Z419 Encounter for procedure for purposes other than remedying health state, unspecified: Secondary | ICD-10-CM

## 2019-08-12 DIAGNOSIS — I10 Essential (primary) hypertension: Secondary | ICD-10-CM | POA: Diagnosis present

## 2019-08-12 DIAGNOSIS — Z90710 Acquired absence of both cervix and uterus: Secondary | ICD-10-CM

## 2019-08-12 DIAGNOSIS — M479 Spondylosis, unspecified: Secondary | ICD-10-CM | POA: Diagnosis present

## 2019-08-12 DIAGNOSIS — Z79890 Hormone replacement therapy (postmenopausal): Secondary | ICD-10-CM | POA: Diagnosis not present

## 2019-08-12 DIAGNOSIS — Z833 Family history of diabetes mellitus: Secondary | ICD-10-CM | POA: Diagnosis not present

## 2019-08-12 DIAGNOSIS — Z7982 Long term (current) use of aspirin: Secondary | ICD-10-CM

## 2019-08-12 DIAGNOSIS — L02214 Cutaneous abscess of groin: Principal | ICD-10-CM | POA: Diagnosis present

## 2019-08-12 DIAGNOSIS — E039 Hypothyroidism, unspecified: Secondary | ICD-10-CM | POA: Diagnosis present

## 2019-08-12 DIAGNOSIS — Z6831 Body mass index (BMI) 31.0-31.9, adult: Secondary | ICD-10-CM

## 2019-08-12 DIAGNOSIS — Z7984 Long term (current) use of oral hypoglycemic drugs: Secondary | ICD-10-CM

## 2019-08-12 DIAGNOSIS — I2510 Atherosclerotic heart disease of native coronary artery without angina pectoris: Secondary | ICD-10-CM | POA: Diagnosis present

## 2019-08-12 DIAGNOSIS — I959 Hypotension, unspecified: Secondary | ICD-10-CM | POA: Diagnosis not present

## 2019-08-12 DIAGNOSIS — I6523 Occlusion and stenosis of bilateral carotid arteries: Secondary | ICD-10-CM

## 2019-08-12 HISTORY — PX: I & D EXTREMITY: SHX5045

## 2019-08-12 LAB — COMPREHENSIVE METABOLIC PANEL
ALT: 13 U/L (ref 0–44)
AST: 18 U/L (ref 15–41)
Albumin: 2.9 g/dL — ABNORMAL LOW (ref 3.5–5.0)
Alkaline Phosphatase: 90 U/L (ref 38–126)
Anion gap: 15 (ref 5–15)
BUN: 14 mg/dL (ref 6–20)
CO2: 25 mmol/L (ref 22–32)
Calcium: 8.4 mg/dL — ABNORMAL LOW (ref 8.9–10.3)
Chloride: 92 mmol/L — ABNORMAL LOW (ref 98–111)
Creatinine, Ser: 0.81 mg/dL (ref 0.44–1.00)
GFR calc Af Amer: >60 mL/min (ref >60)
GFR calc non Af Amer: >60 mL/min (ref >60)
Glucose, Bld: 316 mg/dL — ABNORMAL HIGH (ref 70–99)
Potassium: 4.9 mmol/L (ref 3.5–5.1)
Sodium: 132 mmol/L — ABNORMAL LOW (ref 135–145)
Total Bilirubin: 1.9 mg/dL — ABNORMAL HIGH (ref 0.3–1.2)
Total Protein: 7.3 g/dL (ref 6.5–8.1)

## 2019-08-12 LAB — LACTIC ACID, PLASMA
Lactic Acid, Venous: 2 mmol/L (ref 0.5–1.9)
Lactic Acid, Venous: 2 mmol/L (ref 0.5–1.9)

## 2019-08-12 LAB — CBC WITH DIFFERENTIAL/PLATELET
Abs Immature Granulocytes: 0.08 10*3/uL — ABNORMAL HIGH (ref 0.00–0.07)
Basophils Absolute: 0.1 10*3/uL (ref 0.0–0.1)
Basophils Relative: 0 %
Eosinophils Absolute: 0 10*3/uL (ref 0.0–0.5)
Eosinophils Relative: 0 %
HCT: 46 % (ref 36.0–46.0)
Hemoglobin: 15.5 g/dL — ABNORMAL HIGH (ref 12.0–15.0)
Immature Granulocytes: 1 %
Lymphocytes Relative: 8 %
Lymphs Abs: 1 10*3/uL (ref 0.7–4.0)
MCH: 30.6 pg (ref 26.0–34.0)
MCHC: 33.7 g/dL (ref 30.0–36.0)
MCV: 90.9 fL (ref 80.0–100.0)
Monocytes Absolute: 1 10*3/uL (ref 0.1–1.0)
Monocytes Relative: 8 %
Neutro Abs: 10.9 10*3/uL — ABNORMAL HIGH (ref 1.7–7.7)
Neutrophils Relative %: 83 %
Platelets: 255 10*3/uL (ref 150–400)
RBC: 5.06 MIL/uL (ref 3.87–5.11)
RDW: 13.3 % (ref 11.5–15.5)
WBC: 13.1 10*3/uL — ABNORMAL HIGH (ref 4.0–10.5)
nRBC: 0 % (ref 0.0–0.2)

## 2019-08-12 LAB — URINALYSIS, ROUTINE W REFLEX MICROSCOPIC
Bilirubin Urine: NEGATIVE
Glucose, UA: >=500 mg/dL — AB
Ketones, ur: NEGATIVE mg/dL
Leukocytes,Ua: NEGATIVE
Nitrite: NEGATIVE
Protein, ur: 100 mg/dL — AB
Specific Gravity, Urine: 1.025 (ref 1.005–1.030)
pH: 5 (ref 5.0–8.0)

## 2019-08-12 LAB — GLUCOSE, CAPILLARY
Glucose-Capillary: 191 mg/dL — ABNORMAL HIGH (ref 70–99)
Glucose-Capillary: 206 mg/dL — ABNORMAL HIGH (ref 70–99)

## 2019-08-12 LAB — RESPIRATORY PANEL BY RT PCR (FLU A&B, COVID)
Influenza A by PCR: NEGATIVE
Influenza B by PCR: NEGATIVE
SARS Coronavirus 2 by RT PCR: NEGATIVE

## 2019-08-12 SURGERY — IRRIGATION AND DEBRIDEMENT EXTREMITY
Anesthesia: General | Site: Groin | Laterality: Left

## 2019-08-12 MED ORDER — NITROGLYCERIN 0.4 MG SL SUBL: 0.4000 mg | SUBLINGUAL_TABLET | SUBLINGUAL | Status: DC | PRN

## 2019-08-12 MED ORDER — ONDANSETRON HCL 4 MG/2ML IJ SOLN: 4.0000 mg | Freq: Four times a day (QID) | INTRAMUSCULAR | Status: DC | PRN

## 2019-08-12 MED ORDER — PHENOL 1.4 % MT LIQD: 1.0000 | OROMUCOSAL | Status: DC | PRN

## 2019-08-12 MED ORDER — SODIUM CHLORIDE 0.9% FLUSH: 3.0000 mL | INTRAVENOUS | Status: DC | PRN

## 2019-08-12 MED ORDER — 0.9 % SODIUM CHLORIDE (POUR BTL) OPTIME
TOPICAL | Status: DC | PRN
  Administered 2019-08-12: 2000 mL

## 2019-08-12 MED ORDER — POTASSIUM CHLORIDE CRYS ER 20 MEQ PO TBCR
20.0000 meq | EXTENDED_RELEASE_TABLET | Freq: Once | ORAL | Status: DC
  Filled 2019-08-12: qty 1

## 2019-08-12 MED ORDER — ENOXAPARIN SODIUM 40 MG/0.4ML ~~LOC~~ SOLN
40.0000 mg | SUBCUTANEOUS | Status: DC
  Administered 2019-08-13 – 2019-08-17 (×5): 40 mg via SUBCUTANEOUS
  Filled 2019-08-12 (×5): qty 0.4

## 2019-08-12 MED ORDER — DOCUSATE SODIUM 100 MG PO CAPS
100.0000 mg | ORAL_CAPSULE | Freq: Two times a day (BID) | ORAL | Status: DC
  Administered 2019-08-12 – 2019-08-16 (×5): 100 mg via ORAL
  Filled 2019-08-12 (×9): qty 1

## 2019-08-12 MED ORDER — METOCLOPRAMIDE HCL 5 MG/ML IJ SOLN
INTRAMUSCULAR | Status: DC | PRN
  Administered 2019-08-12: 10 mg via INTRAVENOUS

## 2019-08-12 MED ORDER — SODIUM CHLORIDE 0.9 % IV SOLN
2.0000 g | Freq: Once | INTRAVENOUS | Status: AC
  Administered 2019-08-12: 2 g via INTRAVENOUS
  Filled 2019-08-12: qty 20

## 2019-08-12 MED ORDER — HYDRALAZINE HCL 20 MG/ML IJ SOLN: 5.0000 mg | INTRAMUSCULAR | Status: DC | PRN

## 2019-08-12 MED ORDER — SODIUM CHLORIDE 0.9% FLUSH
3.0000 mL | Freq: Two times a day (BID) | INTRAVENOUS | Status: DC
  Administered 2019-08-12 – 2019-08-16 (×7): 3 mL via INTRAVENOUS

## 2019-08-12 MED ORDER — VANCOMYCIN HCL 1500 MG/300ML IV SOLN
1500.0000 mg | Freq: Once | INTRAVENOUS | Status: AC
  Administered 2019-08-12: 14:00:00 1500 mg via INTRAVENOUS
  Filled 2019-08-12: qty 300

## 2019-08-12 MED ORDER — SUCCINYLCHOLINE CHLORIDE 20 MG/ML IJ SOLN
INTRAMUSCULAR | Status: DC | PRN
  Administered 2019-08-12: 100 mg via INTRAVENOUS

## 2019-08-12 MED ORDER — MORPHINE SULFATE (PF) 2 MG/ML IV SOLN: 2.0000 mg | INTRAVENOUS | Status: DC | PRN

## 2019-08-12 MED ORDER — ALUM & MAG HYDROXIDE-SIMETH 200-200-20 MG/5ML PO SUSP
15.0000 mL | ORAL | Status: DC | PRN
  Administered 2019-08-14: 30 mL via ORAL
  Filled 2019-08-12: qty 30

## 2019-08-12 MED ORDER — IOHEXOL 300 MG/ML  SOLN
100.0000 mL | Freq: Once | INTRAMUSCULAR | Status: AC | PRN
  Administered 2019-08-12: 100 mL via INTRAVENOUS

## 2019-08-12 MED ORDER — PROPOFOL 10 MG/ML IV BOLUS
INTRAVENOUS | Status: AC
  Filled 2019-08-12: qty 20

## 2019-08-12 MED ORDER — GABAPENTIN 400 MG PO CAPS
400.0000 mg | ORAL_CAPSULE | Freq: Three times a day (TID) | ORAL | Status: DC
  Administered 2019-08-12 – 2019-08-17 (×14): 400 mg via ORAL
  Filled 2019-08-12 (×14): qty 1

## 2019-08-12 MED ORDER — LABETALOL HCL 5 MG/ML IV SOLN: 10.0000 mg | INTRAVENOUS | Status: DC | PRN

## 2019-08-12 MED ORDER — MIDAZOLAM HCL 2 MG/2ML IJ SOLN
INTRAMUSCULAR | Status: AC
  Filled 2019-08-12: qty 2

## 2019-08-12 MED ORDER — METOPROLOL TARTRATE 50 MG PO TABS
50.0000 mg | ORAL_TABLET | Freq: Two times a day (BID) | ORAL | Status: DC
  Administered 2019-08-12 – 2019-08-17 (×9): 50 mg via ORAL
  Filled 2019-08-12 (×9): qty 1

## 2019-08-12 MED ORDER — ACETAMINOPHEN 325 MG PO TABS
325.0000 mg | ORAL_TABLET | ORAL | Status: DC | PRN
  Administered 2019-08-12 – 2019-08-16 (×2): 650 mg via ORAL
  Filled 2019-08-12 (×2): qty 2

## 2019-08-12 MED ORDER — LISINOPRIL 5 MG PO TABS
5.0000 mg | ORAL_TABLET | Freq: Every day | ORAL | Status: DC
  Administered 2019-08-12 – 2019-08-17 (×5): 5 mg via ORAL
  Filled 2019-08-12 (×5): qty 1

## 2019-08-12 MED ORDER — GLIPIZIDE 10 MG PO TABS
10.0000 mg | ORAL_TABLET | Freq: Every day | ORAL | Status: DC
  Administered 2019-08-13 – 2019-08-17 (×5): 10 mg via ORAL
  Filled 2019-08-12 (×5): qty 1

## 2019-08-12 MED ORDER — LINAGLIPTIN 5 MG PO TABS
5.0000 mg | ORAL_TABLET | Freq: Every day | ORAL | Status: DC
  Administered 2019-08-12 – 2019-08-17 (×6): 5 mg via ORAL
  Filled 2019-08-12 (×6): qty 1

## 2019-08-12 MED ORDER — FENTANYL CITRATE (PF) 250 MCG/5ML IJ SOLN
INTRAMUSCULAR | Status: AC
  Filled 2019-08-12: qty 5

## 2019-08-12 MED ORDER — VANCOMYCIN HCL IN DEXTROSE 1-5 GM/200ML-% IV SOLN: 1000.0000 mg | Freq: Once | INTRAVENOUS | Status: DC

## 2019-08-12 MED ORDER — PRAVASTATIN SODIUM 40 MG PO TABS
40.0000 mg | ORAL_TABLET | Freq: Every day | ORAL | Status: DC
  Administered 2019-08-12 – 2019-08-17 (×6): 40 mg via ORAL
  Filled 2019-08-12 (×6): qty 1

## 2019-08-12 MED ORDER — ONDANSETRON HCL 4 MG/2ML IJ SOLN
INTRAMUSCULAR | Status: DC | PRN
  Administered 2019-08-12: 4 mg via INTRAVENOUS

## 2019-08-12 MED ORDER — SODIUM CHLORIDE 0.9 % IV SOLN
1.0000 g | INTRAVENOUS | Status: DC
  Administered 2019-08-13 – 2019-08-15 (×3): 1 g via INTRAVENOUS
  Filled 2019-08-12 (×3): qty 1

## 2019-08-12 MED ORDER — NICOTINE 21 MG/24HR TD PT24
21.0000 mg | MEDICATED_PATCH | Freq: Every day | TRANSDERMAL | Status: DC
  Administered 2019-08-12 – 2019-08-17 (×6): 21 mg via TRANSDERMAL
  Filled 2019-08-12 (×6): qty 1

## 2019-08-12 MED ORDER — ASPIRIN EC 81 MG PO TBEC
81.0000 mg | DELAYED_RELEASE_TABLET | Freq: Every evening | ORAL | Status: DC
  Administered 2019-08-12 – 2019-08-16 (×5): 81 mg via ORAL
  Filled 2019-08-12 (×5): qty 1

## 2019-08-12 MED ORDER — SODIUM CHLORIDE 0.9 % IV BOLUS
1000.0000 mL | Freq: Once | INTRAVENOUS | Status: AC
  Administered 2019-08-12: 12:00:00 1000 mL via INTRAVENOUS

## 2019-08-12 MED ORDER — PROPOFOL 10 MG/ML IV BOLUS
INTRAVENOUS | Status: DC | PRN
  Administered 2019-08-12: 50 mg via INTRAVENOUS
  Administered 2019-08-12: 150 mg via INTRAVENOUS

## 2019-08-12 MED ORDER — SODIUM CHLORIDE 0.9 % IV SOLN: 250.0000 mL | INTRAVENOUS | Status: DC | PRN

## 2019-08-12 MED ORDER — CANAGLIFLOZIN 100 MG PO TABS
100.0000 mg | ORAL_TABLET | Freq: Every day | ORAL | Status: DC
  Administered 2019-08-13 – 2019-08-17 (×5): 100 mg via ORAL
  Filled 2019-08-12 (×5): qty 1

## 2019-08-12 MED ORDER — GUAIFENESIN-DM 100-10 MG/5ML PO SYRP: 15.0000 mL | ORAL_SOLUTION | ORAL | Status: DC | PRN

## 2019-08-12 MED ORDER — LACTATED RINGERS IV SOLN: INTRAVENOUS | Status: DC | PRN

## 2019-08-12 MED ORDER — ACETAMINOPHEN 325 MG PO TABS
650.0000 mg | ORAL_TABLET | Freq: Once | ORAL | Status: AC
  Administered 2019-08-12: 650 mg via ORAL
  Filled 2019-08-12: qty 2

## 2019-08-12 MED ORDER — FENTANYL CITRATE (PF) 100 MCG/2ML IJ SOLN
INTRAMUSCULAR | Status: DC | PRN
  Administered 2019-08-12 (×2): 75 ug via INTRAVENOUS
  Administered 2019-08-12: 100 ug via INTRAVENOUS

## 2019-08-12 MED ORDER — ACETAMINOPHEN 325 MG RE SUPP: 325.0000 mg | RECTAL | Status: DC | PRN

## 2019-08-12 MED ORDER — LIDOCAINE 2% (20 MG/ML) 5 ML SYRINGE
INTRAMUSCULAR | Status: DC | PRN
  Administered 2019-08-12: 30 mg via INTRAVENOUS

## 2019-08-12 MED ORDER — OXYCODONE HCL 5 MG PO TABS: 5.0000 mg | ORAL_TABLET | ORAL | Status: DC | PRN

## 2019-08-12 MED ORDER — LEVOTHYROXINE SODIUM 25 MCG PO TABS
25.0000 ug | ORAL_TABLET | Freq: Every day | ORAL | Status: DC
  Administered 2019-08-13 – 2019-08-17 (×5): 25 ug via ORAL
  Filled 2019-08-12 (×5): qty 1

## 2019-08-12 MED ORDER — VANCOMYCIN HCL 1250 MG/250ML IV SOLN
1250.0000 mg | INTRAVENOUS | Status: DC
  Administered 2019-08-13 – 2019-08-15 (×3): 1250 mg via INTRAVENOUS
  Filled 2019-08-12 (×3): qty 250

## 2019-08-12 MED ORDER — MIDAZOLAM HCL 5 MG/5ML IJ SOLN
INTRAMUSCULAR | Status: DC | PRN
  Administered 2019-08-12 (×2): 1 mg via INTRAVENOUS

## 2019-08-12 MED ORDER — PANTOPRAZOLE SODIUM 40 MG PO TBEC
40.0000 mg | DELAYED_RELEASE_TABLET | Freq: Every day | ORAL | Status: DC
  Administered 2019-08-13 – 2019-08-17 (×5): 40 mg via ORAL
  Filled 2019-08-12 (×5): qty 1

## 2019-08-12 MED ORDER — SODIUM CHLORIDE 0.9% FLUSH: 3.0000 mL | Freq: Once | INTRAVENOUS | Status: DC

## 2019-08-12 MED ORDER — METOPROLOL TARTRATE 5 MG/5ML IV SOLN: 2.0000 mg | INTRAVENOUS | Status: DC | PRN

## 2019-08-12 SURGICAL SUPPLY — 36 items
BNDG ELASTIC 4X5.8 VLCR STR LF (GAUZE/BANDAGES/DRESSINGS) IMPLANT
BNDG ELASTIC 6X5.8 VLCR STR LF (GAUZE/BANDAGES/DRESSINGS) IMPLANT
BNDG GAUZE ELAST 4 BULKY (GAUZE/BANDAGES/DRESSINGS) ×1 IMPLANT
CANISTER SUCT 3000ML PPV (MISCELLANEOUS) ×2 IMPLANT
COVER SURGICAL LIGHT HANDLE (MISCELLANEOUS) ×2 IMPLANT
COVER WAND RF STERILE (DRAPES) ×1 IMPLANT
ELECT REM PT RETURN 9FT ADLT (ELECTROSURGICAL) ×2
ELECTRODE REM PT RTRN 9FT ADLT (ELECTROSURGICAL) ×1 IMPLANT
GAUZE SPONGE 4X4 12PLY STRL (GAUZE/BANDAGES/DRESSINGS) ×1 IMPLANT
GAUZE SPONGE 4X4 12PLY STRL LF (GAUZE/BANDAGES/DRESSINGS) ×1 IMPLANT
GLOVE BIO SURGEON STRL SZ7.5 (GLOVE) ×2 IMPLANT
GLOVE BIOGEL PI IND STRL 6.5 (GLOVE) IMPLANT
GLOVE BIOGEL PI IND STRL 7.5 (GLOVE) IMPLANT
GLOVE BIOGEL PI INDICATOR 6.5 (GLOVE) ×1
GLOVE BIOGEL PI INDICATOR 7.5 (GLOVE) ×2
GLOVE SURG SS PI 7.5 STRL IVOR (GLOVE) ×2 IMPLANT
GOWN STRL REUS W/ TWL LRG LVL3 (GOWN DISPOSABLE) ×3 IMPLANT
GOWN STRL REUS W/TWL LRG LVL3 (GOWN DISPOSABLE) ×6
KIT BASIN OR (CUSTOM PROCEDURE TRAY) ×2 IMPLANT
KIT TURNOVER KIT B (KITS) ×2 IMPLANT
NS IRRIG 1000ML POUR BTL (IV SOLUTION) ×3 IMPLANT
PACK GENERAL/GYN (CUSTOM PROCEDURE TRAY) IMPLANT
PACK PERIPHERAL VASCULAR (CUSTOM PROCEDURE TRAY) ×1 IMPLANT
PACK UNIVERSAL I (CUSTOM PROCEDURE TRAY) IMPLANT
PAD ABD 8X10 STRL (GAUZE/BANDAGES/DRESSINGS) ×1 IMPLANT
PAD ARMBOARD 7.5X6 YLW CONV (MISCELLANEOUS) ×4 IMPLANT
STAPLER VISISTAT 35W (STAPLE) IMPLANT
SUT ETHILON 3 0 PS 1 (SUTURE) IMPLANT
SUT VIC AB 2-0 CTX 36 (SUTURE) IMPLANT
SUT VIC AB 3-0 SH 27 (SUTURE)
SUT VIC AB 3-0 SH 27X BRD (SUTURE) IMPLANT
SUT VICRYL 4-0 PS2 18IN ABS (SUTURE) IMPLANT
SWAB CULTURE LIQUID MINI MALE (MISCELLANEOUS) ×2 IMPLANT
TAPE CLOTH SURG 4X10 WHT LF (GAUZE/BANDAGES/DRESSINGS) ×1 IMPLANT
TOWEL GREEN STERILE (TOWEL DISPOSABLE) ×2 IMPLANT
WATER STERILE IRR 1000ML POUR (IV SOLUTION) ×2 IMPLANT

## 2019-08-12 NOTE — ED Provider Notes (Signed)
[facility] EMERGENCY DEPARTMENT Provider Note   CSN: [id number] Arrival date & time: 08/12/19  7169     History Chief Complaint  Patient presents with  . abd swelling     Olivia [name] is a 59 y.o. female history of diabetes, CAD, hypertension, aorto-bifemoral bypass graft in 2018 by Dr. Myra Gianotti.  She presents today for pain and swelling of her left inguinal region onset 3-4 days ago.  She describes an initial small area of erythema which is gradually increased in size and now involves the entire left inguinal region and suprapubic area.  She describes a moderate intensity burning throbbing sensation worsened with palpation improved with rest, pain is nonradiating.  Denies fever, headache, chest pain/shortness of breath, abdominal pain, nausea/vomiting, dysuria/hematuria, numbness/weakness, tingling, extremity swelling/color change or any additional concerns.  HPI     Past Medical History:  Diagnosis Date  . Arthritis    back  . 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 Coronary artery disease   . Diabetes mellitus without complication (HCC)    Type II  . Hypertension   . Hypothyroidism   . Myocardial infarction (HCC) 2012  . PONV (postoperative nausea and vomiting)     Patient Active Problem List   Diagnosis Date Noted  . Aortoiliac occlusive disease (HCC) 08/18/2016  . Right-sided extracranial carotid artery stenosis 07/23/2016  . Left-sided carotid artery disease (HCC) 05/04/2016  . Claudication (HCC) 05/04/2016  . Coronary artery disease due to lipid rich plaque 05/04/2016  . Essential hypertension 05/04/2016  . Hyperlipidemia 05/04/2016  . Diabetes (HCC) 05/04/2016  . Well woman exam with routine gynecological exam 11/11/2014  . HNP (herniated nucleus pulposus), lumbar 06/28/2014    Past Surgical History:  Procedure Laterality Date  . ABDOMINAL HYSTERECTOMY  2003   partial   . AORTA - BILATERAL FEMORAL ARTERY BYPASS GRAFT Bilateral 08/18/2016   Procedure: AORTOBIFEMORAL BYPASS GRAFT using 19mm x 55mm hemashield graft;  Surgeon: Nada Libman, MD;  Location: [facility] OR;  Service: Vascular;  Laterality: Bilateral;  . CARDIAC CATHETERIZATION  2012   DES pCX 02/25/11  . CESAREAN SECTION    . CHOLECYSTECTOMY  1995  . ENDARTERECTOMY Right 07/23/2016   Procedure: RIGHT CAROTID ENDARTERECTOMY;  Surgeon: Nada Libman, MD;  Location: [facility] OR;  Service: Vascular;  Laterality: Right;  . laser back surgery  07/01/2015  . LUMBAR LAMINECTOMY/DECOMPRESSION MICRODISCECTOMY Right 06/28/2014   Procedure: LUMBAR LAMINECTOMY/DECOMPRESSION MICRODISCECTOMY 1 LEVEL;  Surgeon: Coletta Memos, MD;  Location: [facility] NEURO ORS;  Service: Neurosurgery;  Laterality: Right;  Right L5S1 far lateral microdiskectomy  . PATCH ANGIOPLASTY Right 07/23/2016   Procedure: PATCH ANGIOPLASTY USING Livia Snellen VASCULAR PATCH;  Surgeon: Nada Libman, MD;  Location: [facility] OR;  Service: Vascular;  Laterality: Right;     OB History   No obstetric history on file.     Family History  Problem Relation Age of Onset  . Diabetes Mother   . Diabetes Father   . Diabetes Brother   . Diabetes Brother     Social History   Tobacco Use  . Smoking status: Current Every Day Smoker    Packs/day: 1.00    Years: 40.00    Pack years: 40.00    Types: Cigarettes    Start date: 01/03/1976  . Smokeless tobacco: Never Used  Substance Use Topics  . Alcohol use: No    Alcohol/week: 0.0 standard drinks  . Drug  use: No    Home Medications Prior to Admission medications   Medication Sig Start Date End Date Taking? Authorizing Provider  aspirin EC 81 MG tablet Take 81 mg by mouth every evening.     [provider]  Aspirin-Acetaminophen-Caffeine (GOODY HEADACHE PO) Take 1 packet by mouth daily as needed (headaches).    [provider]  cephALEXin (KEFLEX) 500 MG capsule Take 1 capsule (500 mg total) by mouth 3  (three) times daily. 10/27/16   Nickel, Carma Lair, NP  cyanocobalamin 1000 MCG tablet Take 1,000 mcg by mouth daily.    [provider]  gabapentin (NEURONTIN) 400 MG capsule Take 400 mg by mouth 3 (three) times daily.    [provider]  glipiZIDE (GLUCOTROL) 10 MG tablet Take 10 mg by mouth daily before breakfast.    [provider]  JARDIANCE 25 MG TABS tablet Take 25 mg by mouth every evening.  07/12/16   [provider]  levothyroxine (SYNTHROID, LEVOTHROID) 25 MCG tablet Take 25 mcg by mouth daily before breakfast.    [provider]  lisinopril (PRINIVIL,ZESTRIL) 5 MG tablet Take 1 tablet (5 mg total) by mouth daily. 03/31/15   Laqueta Linden, MD  metFORMIN (GLUCOPHAGE) 1000 MG tablet Take 1,000 mg by mouth 2 (two) times daily with a meal.    [provider]  metoprolol (LOPRESSOR) 50 MG tablet TAKE ONE TABLET BY MOUTH TWICE DAILY 04/21/16   Laqueta Linden, MD  nicotine (NICODERM CQ - DOSED IN MG/24 HOURS) 21 mg/24hr patch Place 1 patch (21 mg total) onto the skin daily. 08/22/16   Fransisco Hertz, MD  nitroGLYCERIN (NITROSTAT) 0.4 MG SL tablet Place 1 tablet (0.4 mg total) under the tongue every 5 (five) minutes as needed for chest pain. 03/07/15   Laqueta Linden, MD  pravastatin (PRAVACHOL) 40 MG tablet Take 40 mg by mouth at bedtime.     [provider]  Pyridoxine HCl (VITAMIN B-6) 500 MG tablet Take 500 mg by mouth daily.    [provider]  vitamin E 400 UNIT capsule Take 400 Units by mouth daily.    [provider]    Allergies    No known allergies  Review of Systems   Review of Systems Ten systems are reviewed and are negative for acute change except as noted in the HPI  Physical Exam Updated Vital Signs BP (!) 117/55   Pulse 87   Temp (!) 100.4 F (38 C) (Oral)   Resp 20   Ht 5\' 3"  (1.6 m)   Wt 81.6 kg   SpO2 95%   BMI 31.89 kg/m   Physical Exam Constitutional:       General: She is not in acute distress.    Appearance: Normal appearance. She is well-developed. She is obese. She is not ill-appearing or diaphoretic.  HENT:     Head: Normocephalic and atraumatic.     Right Ear: External ear normal.     Left Ear: External ear normal.     Nose: Nose normal.  Eyes:     General: Vision grossly intact. Gaze aligned appropriately.     Pupils: Pupils are equal, round, and reactive to light.  Neck:     Trachea: Trachea and phonation normal. No tracheal deviation.  Cardiovascular:     Rate and Rhythm: Normal rate and regular rhythm.     Pulses:          Dorsalis pedis pulses are 1+ on the  right side and 1+ on the left side.  Pulmonary:     Effort: Pulmonary effort is normal. No respiratory distress.  Abdominal:     General: There is no distension.     Palpations: Abdomen is soft.     Tenderness: There is no abdominal tenderness. There is no guarding or rebound.       Comments: Significant erythema and induration of the left inguinal area extending up to the suprapubic area.  Patient reports that it does not involve the vulva; GU area was not evaluated as patient deferred.  Genitourinary:    Comments: Deferred by patient Musculoskeletal:        General: Normal range of motion.     Cervical back: Normal range of motion.  Feet:     Right foot:     Protective Sensation: 3 sites tested. 3 sites sensed.     Left foot:     Protective Sensation: 3 sites tested. 3 sites sensed.  Skin:    General: Skin is warm and dry.  Neurological:     Mental Status: She is alert.     GCS: GCS eye subscore is 4. GCS verbal subscore is 5. GCS motor subscore is 6.     Comments: Speech is clear and goal oriented, follows commands Major Cranial nerves without deficit, no facial droop Moves extremities without ataxia, coordination intact  Psychiatric:        Behavior: Behavior normal.     ED Results / Procedures / Treatments   Labs (all labs ordered are listed, but only  abnormal results are displayed) Labs Reviewed  LACTIC ACID, PLASMA - Abnormal; Notable for the following components:      Result Value   Lactic Acid, Venous 2.0 (*)    All other components within normal limits  LACTIC ACID, PLASMA - Abnormal; Notable for the following components:   Lactic Acid, Venous 2.0 (*)    All other components within normal limits  COMPREHENSIVE METABOLIC PANEL - Abnormal; Notable for the following components:   Sodium 132 (*)    Chloride 92 (*)    Glucose, Bld 316 (*)    Calcium 8.4 (*)    Albumin 2.9 (*)    Total Bilirubin 1.9 (*)    All other components within normal limits  CBC WITH DIFFERENTIAL/PLATELET - Abnormal; Notable for the following components:   WBC 13.1 (*)    Hemoglobin 15.5 (*)    Neutro Abs 10.9 (*)    Abs Immature Granulocytes 0.08 (*)    All other components within normal limits  URINALYSIS, ROUTINE W REFLEX MICROSCOPIC - Abnormal; Notable for the following components:   Color, Urine AMBER (*)    APPearance HAZY (*)    Glucose, UA >=500 (*)    Hgb urine dipstick SMALL (*)    Protein, ur 100 (*)    Bacteria, UA RARE (*)    All other components within normal limits  CULTURE, BLOOD (ROUTINE X 2)  CULTURE, BLOOD (ROUTINE X 2)  RESPIRATORY PANEL BY RT PCR (FLU A&B, COVID)    EKG None  Radiology CT ABDOMEN PELVIS W CONTRAST  Result Date: 08/12/2019 CLINICAL DATA:  59 year old female with history of suspected soft tissue infection with significant swelling, pain and redness in the lower abdomen since Wednesday. Chills. EXAM: CT ABDOMEN AND PELVIS WITH CONTRAST TECHNIQUE: Multidetector CT imaging of the abdomen and pelvis was performed using the standard protocol following bolus administration of intravenous contrast. CONTRAST:  OMNIPAQUE IOHEXOL 300 MG/ML  SOLN COMPARISON:  CT angiography of the abdomen and pelvis 09/13/2016. FINDINGS: Lower chest: Atherosclerotic calcifications in the descending thoracic aorta as well as the right  coronary artery. Hepatobiliary: 1.4 cm low-attenuation lesion in the central aspect of segment 2 of the liver, compatible with a simple cyst. No other suspicious hepatic lesions. No intra or extrahepatic biliary ductal dilatation. Status post cholecystectomy. Pancreas: Distal pancreatic body and tail are either severely atrophic or surgically absent. No pancreatic mass. No pancreatic ductal dilatation. No pancreatic or peripancreatic fluid collections or inflammatory changes. Spleen: Unremarkable. Adrenals/Urinary Tract: 4 mm nonobstructive calculus in the lower pole collecting system of left kidney. Subcentimeter low-attenuation lesion in the interpolar region of the left kidney, too small to characterize, but statistically likely to represent a tiny cyst. Right kidney and bilateral adrenal glands are normal in appearance. No hydroureteronephrosis. Urinary bladder is normal in appearance. Stomach/Bowel: Normal appearance of the stomach. No pathologic dilatation of small bowel or colon. Normal appendix. Vascular/Lymphatic: Severe aortic atherosclerosis with marked narrowing of the abdominal aorta (luminal diameter of approximately 6 mm) at and immediately below the level of the renal artery origins. 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 Status post aorto bi-iliac bypass graft which is patent bilaterally. No lymphadenopathy noted in the abdomen or pelvis. Several prominent but nonenlarged left external iliac and operator lymph nodes measuring up to 8 mm in short axis are noted, nonspecific and likely reactive. Reproductive: Status post hysterectomy.  Ovaries are a trophic. Other: No significant volume of ascites.  No pneumoperitoneum. Musculoskeletal: In the subcutaneous fat of the left inguinal region there is a well-defined rim enhancing fluid collection (axial image 88 of series 3 and coronal image 33 of series 6) measuring approximately 5.6 x  5.1 x 5.3 cm, highly concerning for abscess. Given the presence of a large fluid collection in this region on remote prior CTA of the abdomen and pelvis dated 09/13/2016, this likely reflects infection of a pre-existing fluid collection. Extensive soft tissue stranding throughout the adjacent subcutaneous fat, with overlying skin thickening, likely reflective of an associated cellulitis. There are no aggressive appearing lytic or blastic lesions noted in the visualized portions of the skeleton. IMPRESSION: 1. Large abscess in the left inguinal region measuring 5.6 x 5.1 x 5.3 cm with adjacent cellulitis, as detailed above. 2. Aortic atherosclerosis, in addition to at least right coronary artery disease. There is also severe stenosis of the abdominal aorta (6 mm) at and adjacent to the level of the renal artery origins immediately above the level of the aorto bi-iliac bypass graft which is patent. 3. Additional incidental findings, as above. Electronically Signed   By: Vinnie Langton M.D.   On: 08/12/2019 13:30    Procedures Procedures (including critical care time)  Medications Ordered in ED Medications  sodium chloride flush (NS) 0.9 % injection 3 mL (3 mLs Intravenous Not Given 08/12/19 1128)  vancomycin (VANCOREADY) IVPB 1500 mg/300 mL (1,500 mg Intravenous New Bag/Given 08/12/19 1344)  vancomycin (VANCOREADY) IVPB 1250 mg/250 mL (has no administration in time range)  sodium chloride 0.9 % bolus 1,000 mL (0 mLs Intravenous Stopped 08/12/19 1340)  cefTRIAXone (ROCEPHIN) 2 g in sodium chloride 0.9 % 100 mL IVPB (0 g Intravenous Stopped 08/12/19 1340)  acetaminophen (TYLENOL) tablet 650 mg (650 mg Oral Given 08/12/19 1156)  iohexol (OMNIPAQUE) 300 MG/ML solution 100 mL (100 mLs Intravenous Contrast Given 08/12/19 1243)    ED Course  I have reviewed  the triage vital signs and the nursing notes.  Pertinent labs & imaging results that were available during my care of the patient were reviewed by me and  considered in my medical decision making (see chart for details).  Clinical Course as of Aug 12 1406  Sun Aug 12, 2019  1337 Dr. Darrick Penna   [BM]    Clinical Course User Index [BM] Elizabeth Palau   MDM Rules/Calculators/A&P                     59 year old female with history as detailed above presents today with significant cellulitis and apparent abscess of the left inguinal region, she had aortobifemoral bypass graft performed in March 2018 by Dr. Myra Gianotti.  She is nontoxic on initial evaluation and does not appear septic at this time, initial tachycardia present on triage vitals resolved prior to intervention.  She is neurovascular intact to bilateral lower extremities.  Basic lab work obtained in triage, will obtain CT abdomen pelvis to assess for size and depth of abscess and to see if there is involvement of the bypass graft.  Fluid bolus given, empiric antibiotics vancomycin and Rocephin have been ordered. - Lab work reviewed significant for leukocytosis of 13.1 with left shift, no evidence of anemia.  CMP shows elevated glucose, mild hyponatremia, borderline gap of 15, no emergent electrolyte derangement, evidence of kidney injury or significant elevation of LFTs.  Urinalysis shows rare bacteria, 6/10 WBCs and RBCs, protein hemoglobin and glucose, patient without urinary symptoms doubt UTI, will be covered with Rocephin given for abscess/cellulitis.  Lactic borderline elevated at 2.0.  Patient developed low-grade fever was treated with Tylenol.  CT AP:  IMPRESSION:  1. Large abscess in the left inguinal region measuring 5.6 x 5.1 x  5.3 cm with adjacent cellulitis, as detailed above.  2. Aortic atherosclerosis, in addition to at least right coronary  artery disease. There is also severe stenosis of the abdominal aorta  (6 mm) at and adjacent to the level of the renal artery origins  immediately above the level of the aorto bi-iliac bypass graft which  is patent.  3. Additional  incidental findings, as above.  CT scan personally reviewed with Dr. Rodena Medin, concerned that this large abscess is directly adjacent to bypass graft, plan to consult vascular surgery.  Patient has received empiric antibiotics and fluids.  On reassessment she is well-appearing no acute distress states understanding of care plan and is agreeable for admission.  No tachycardia, tachypnea, hypotension or hypoxia on room air.  Patient does not appear septic, blood cultures have been ordered. - 1:37 PM: Discussed case with vascular surgeon Dr. Darrick Penna who is accepted patient to his service for admission. - Patient n.p.o., Covid PCR test ordered. Patient was seen and evaluated by Dr. Rodena Medin during this visit who agrees with care plan and admission.   Note: Portions of this report may have been transcribed using voice recognition software. Every effort was made to ensure accuracy; however, inadvertent computerized transcription errors may still be present. Final Clinical Impression(s) / ED Diagnoses Final diagnoses:  Inguinal abscess    Rx / DC Orders ED Discharge Orders    None       Elizabeth Palau 08/12/19 1408    Wynetta Fines, MD 08/16/19 2253

## 2019-08-12 NOTE — Anesthesia Procedure Notes (Signed)
Procedure Name: Intubation Date/Time: 08/12/2019 5:01 PM Performed by: Edmonia Caprio, CRNA Pre-anesthesia Checklist: Patient identified, Emergency Drugs available, Suction available, Patient being monitored and Timeout performed Patient Re-evaluated:Patient Re-evaluated prior to induction Oxygen Delivery Method: Circle system utilized Preoxygenation: Pre-oxygenation with 100% oxygen Induction Type: IV induction Ventilation: Mask ventilation without difficulty Laryngoscope Size: Miller and 2 Grade View: Grade II Tube type: Oral Tube size: 7.5 mm Number of attempts: 1 Airway Equipment and Method: Stylet Placement Confirmation: ETT inserted through vocal cords under direct vision,  positive ETCO2 and breath sounds checked- equal and bilateral Secured at: 22 cm Tube secured with: Tape Dental Injury: Teeth and Oropharynx as per pre-operative assessment

## 2019-08-12 NOTE — Anesthesia Postprocedure Evaluation (Signed)
Anesthesia Post Note  Patient: Olivia [name]  Procedure(s) Performed: Incision and Drainage Left Groin (Left Groin)     Patient location during evaluation: PACU Anesthesia Type: General Level of consciousness: awake and alert Pain management: pain level controlled Vital Signs Assessment: post-procedure vital signs reviewed and stable Respiratory status: spontaneous breathing, nonlabored ventilation, respiratory function stable and patient connected to nasal cannula oxygen Cardiovascular status: blood pressure returned to baseline and stable Postop Assessment: no apparent nausea or vomiting Anesthetic complications: no    Last Vitals:  Vitals:   08/12/19 1900 08/12/19 2000  BP:  (!) 111/58  Pulse: 91 90  Resp: (!) 22 (!) 23  Temp:    SpO2: 94% 90%    Last Pain:  Vitals:   08/12/19 2000  TempSrc:   PainSc: 0-No pain                 Kennieth Rad

## 2019-08-12 NOTE — ED Triage Notes (Signed)
Pt has significant swelling, pain, and redness to lower abd since Wednesday with chills.

## 2019-08-12 NOTE — ED Notes (Signed)
Patient transported to X-ray 

## 2019-08-12 NOTE — Op Note (Signed)
Procedure: Incision and drainage of left groin  Preoperative diagnosis: Left groin abscess  Postoperative diagnosis: Same  Anesthesia: General  Assistant: Nurse   Operative findings: 4 x 4 centimeter abscess cavity  No obvious graft exposed  Specimens: Aerobic and anaerobic culture left groin abscess  Operative details: After obtaining informed consent, the patient was taken to the operating room.  The patient was placed in supine position operating table.  After induction of general anesthesia and endotracheal ovation patient's left groin area was prepped and draped in usual sterile fashion.  Incision was made through a pre-existing scar in an oblique fashion in the left groin.  Incision was carried down through the subcutaneous tissues which were very indurated and thickened.  I then entered an abscess cavity about 3 cm through the subcutaneous tissue with about 200 cc of yellowish creamy material the consistency of eggnog.  This was sampled and sent for anaerobic and aerobic culture.  The wound was thoroughly irrigated with normal saline solution.  Hemostasis was obtained.  The base of the wound was inspected and I did not see any exposed graft.  A saline moistened Kerlix dressing was placed into the wound cavity.  A dry sterile dressing was placed over this.  The patient tolerated procedure well and there were no complications.  The instrument sponge and needle count was correct the end of the case.  The patient was taken the recovery room in stable condition.  Fabienne Bruns, MD [facility] [place] Office: [phone]

## 2019-08-12 NOTE — H&P (Signed)
Patient name: Olivia [name] MRN: [id number] DOB: 07-Nov-1960 Sex: female  HPI: Olivia [name] is a 59 y.o. female, with several day history of anorexia and malaise.  Over last several days has had increased swelling and pain in left groin region.  Pt with prior aortobifem by Dr Trula Slade 2018.  Postoperartively there was a left groin seroma which improved and pt states this has now recurred and gotten worse. Pt husband thinks she had fever today but did not take temperature. She does not really describe claudication but has back pain that limits her walking.  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 Other medical problems include obesity, CAD, diabetes, hypertension. These have been stable.She is on aspirin and a statin.  Past Medical History:  Diagnosis Date  . Arthritis    back  . 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 Coronary artery disease   . Diabetes mellitus without complication ([place])    Type II  . Hypertension   . Hypothyroidism   . Myocardial infarction ([place]) 2012  . PONV (postoperative nausea and vomiting)    Past Surgical History:  Procedure Laterality Date  . ABDOMINAL HYSTERECTOMY  2003   partial  . AORTA - BILATERAL FEMORAL ARTERY BYPASS GRAFT Bilateral 08/18/2016   Procedure: AORTOBIFEMORAL BYPASS GRAFT using 106mm x 47mm hemashield graft;  Surgeon: Serafina Mitchell, MD;  Location: [facility] OR;  Service: Vascular;  Laterality: Bilateral;  . CARDIAC CATHETERIZATION  2012   DES pCX 02/25/11  . CESAREAN SECTION    . CHOLECYSTECTOMY  1995  . ENDARTERECTOMY Right 07/23/2016   Procedure: RIGHT CAROTID ENDARTERECTOMY;  Surgeon: Serafina Mitchell, MD;  Location: [facility] OR;  Service: Vascular;  Laterality: Right;  . laser back surgery  07/01/2015  . LUMBAR LAMINECTOMY/DECOMPRESSION MICRODISCECTOMY Right 06/28/2014   Procedure: LUMBAR  LAMINECTOMY/DECOMPRESSION MICRODISCECTOMY 1 LEVEL;  Surgeon: Ashok Pall, MD;  Location: [facility]URO ORS;  Service: Neurosurgery;  Laterality: Right;  Right L5S1 far lateral microdiskectomy  . PATCH ANGIOPLASTY Right 07/23/2016   Procedure: PATCH ANGIOPLASTY USING Rueben Bash VASCULAR PATCH;  Surgeon: Serafina Mitchell, MD;  Location: [facility] OR;  Service: Vascular;  Laterality: Right;    Family History  Problem Relation Age of Onset  . Diabetes Mother   . Diabetes Father   . Diabetes Brother   . Diabetes Brother     SOCIAL HISTORY: Social History   Socioeconomic History  . Marital status: Married    Spouse name: Not on file  . Number of children: Not on file  . Years of education: Not on file  . Highest education level: Not on file  Occupational History  . Not on file  Tobacco Use  . Smoking status: Current Every Day Smoker    Packs/day: 1.00    Years: 40.00    Pack years: 40.00    Types: Cigarettes    Start date: 01/03/1976  . Smokeless tobacco: Never Used  Substance and Sexual Activity  . Alcohol use: No    Alcohol/week: 0.0 standard drinks  . Drug use: No  . Sexual activity: Not Currently    Birth control/protection: Surgical  Other Topics Concern  . Not on file  Social History Narrative  . Not on file   Social Determinants of Health   Financial Resource Strain:   . Difficulty  of Paying Living Expenses:   Food Insecurity:   . Worried About Programme researcher, broadcasting/film/video in the Last Year:   . Barista in the Last Year:   Transportation Needs:   . Freight forwarder (Medical):   Marland Kitchen Lack of Transportation (Non-Medical):   Physical Activity:   . Days of Exercise per Week:   . Minutes of Exercise per Session:   Stress:   . Feeling of Stress :   Social Connections:   . Frequency of Communication with Friends and Family:   . Frequency of Social Gatherings with Friends and Family:   . Attends Religious Services:   . Active Member of Clubs or Organizations:   . Attends Occupational hygienist Meetings:   Marland Kitchen Marital Status:   Intimate Partner Violence:   . Fear of Current or Ex-Partner:   . Emotionally Abused:   Marland Kitchen Physically Abused:   . Sexually Abused:     No Known Allergies  Current Facility-Administered Medications  Medication Dose Route Frequency Provider Last Rate Last Admin  . sodium chloride flush (NS) 0.9 % injection 3 mL  3 mL Intravenous Once Wynetta Fines, MD      . Melene Muller ON 08/13/2019] vancomycin (VANCOREADY) IVPB 1250 mg/250 mL  1,250 mg Intravenous Q24H Rumbarger, Faye Ramsay, RPH      . vancomycin (VANCOREADY) IVPB 1500 mg/300 mL  1,500 mg Intravenous Once RumbargerFaye Ramsay, RPH 150 mL/hr at 08/12/19 1344 1,500 mg at 08/12/19 1344   Current Outpatient Medications  Medication Sig Dispense Refill  . aspirin EC 81 MG tablet Take 81 mg by mouth every evening.     . Aspirin-Acetaminophen-Caffeine (GOODY HEADACHE PO) Take 1 packet by mouth daily as needed (headaches).    . gabapentin (NEURONTIN) 400 MG capsule Take 400 mg by mouth 3 (three) times daily.    Marland Kitchen glipiZIDE (GLUCOTROL) 10 MG tablet Take 10 mg by mouth daily before breakfast.    . JANUVIA 100 MG tablet Take 100 mg by mouth daily.    Marland Kitchen JARDIANCE 25 MG TABS tablet Take 25 mg by mouth every evening.     Marland Kitchen levothyroxine (SYNTHROID, LEVOTHROID) 25 MCG tablet Take 25 mcg by mouth daily before breakfast.    . lisinopril (PRINIVIL,ZESTRIL) 5 MG tablet Take 1 tablet (5 mg total) by mouth daily.    . metFORMIN (GLUCOPHAGE) 1000 MG tablet Take 1,000 mg by mouth 2 (two) times daily with a meal.    . metoprolol (LOPRESSOR) 50 MG tablet TAKE ONE TABLET BY MOUTH TWICE DAILY (Patient taking differently: Take 50 mg by mouth 2 (two) times daily. ) 180 tablet 3  . nicotine (NICODERM CQ - DOSED IN MG/24 HOURS) 21 mg/24hr patch Place 1 patch (21 mg total) onto the skin daily. 28 patch 1  . nitroGLYCERIN (NITROSTAT) 0.4 MG SL tablet Place 1 tablet (0.4 mg total) under the tongue every 5 (five) minutes as needed  for chest pain. 25 tablet 3  . pravastatin (PRAVACHOL) 40 MG tablet Take 40 mg by mouth daily.       ROS:   General:  No weight loss, +Fever, chills  HEENT: No recent headaches, no nasal bleeding, no visual changes, no sore throat  Neurologic: No dizziness, blackouts, seizures. No recent symptoms of stroke or mini- stroke. No recent episodes of slurred speech, or temporary blindness.  Cardiac: No recent episodes of chest pain/pressure, no shortness of breath at rest.  +shortness of breath with exertion.  Denies history  of atrial fibrillation or irregular heartbeat  Vascular: No history of rest pain in feet.  No history of claudication.  No history of non-healing ulcer, No history of DVT   Pulmonary: No home oxygen, no productive cough, no hemoptysis,  No asthma or wheezing  Musculoskeletal:  [X]  Arthritis, [X]  Low back pain,  [X]  Joint pain  Hematologic:No history of hypercoagulable state.  No history of easy bleeding.  No history of anemia  Gastrointestinal: No hematochezia or melena,  No gastroesophageal reflux, no trouble swallowing  Urinary: [ ]  chronic Kidney disease, [ ]  on HD - [ ]  MWF or [ ]  TTHS, [ ]  Burning with urination, [ ]  Frequent urination, [ ]  Difficulty urinating;   Skin: No rashes  Psychological: No history of anxiety,  No history of depression   Physical Examination  Vitals:   08/12/19 1415 08/12/19 1458 08/12/19 1458 08/12/19 1500  BP: (!) 128/59 112/90  (!) 113/94  Pulse: 92  62 (!) 45  Resp: (!) 22  13 11   Temp:   98.4 F (36.9 C)   TempSrc:   Oral   SpO2: 96%  95% 94%  Weight:      Height:        Body mass index is 31.89 kg/m.  General:  Alert and oriented, no acute distress HEENT: Normal Neck: No bruit or JVD Pulmonary: Clear to auscultation bilaterally Cardiac: Regular Rate and Rhythm without murmur Abdomen: Soft, non-tender, non-distended, no mass Skin: No rash, area of erythema and induration 10 x 10 cm with no drainage left  groin Extremity Pulses:  2+ radial, brachial, difficult to palpate due to obesity and swelling left groin femoral, 1-2+  dorsalis pedis bilaterally Musculoskeletal: No deformity or edema  Neurologic: Upper and lower extremity motor 5/5 and symmetric  DATA:   CT abdomen pelvis large fluid collection with surrounding soft tissue stranding 5 cm fluid collection left groin  70-80% narrowing of juxtarenal aorta probably progressed some since 2018.  I reviewed these images and CT from 2018  CBC    Component Value Date/Time   WBC 13.1 (H) 08/12/2019 0951   RBC 5.06 08/12/2019 0951   HGB 15.5 (H) 08/12/2019 0951   HCT 46.0 08/12/2019 0951   PLT 255 08/12/2019 0951   MCV 90.9 08/12/2019 0951   MCH 30.6 08/12/2019 0951   MCHC 33.7 08/12/2019 0951   RDW 13.3 08/12/2019 0951   LYMPHSABS 1.0 08/12/2019 0951   MONOABS 1.0 08/12/2019 0951   EOSABS 0.0 08/12/2019 0951   BASOSABS 0.1 08/12/2019 0951   BMET    Component Value Date/Time   NA 132 (L) 08/12/2019 0951   NA 137 07/13/2016 1526   K 4.9 08/12/2019 0951   CL 92 (L) 08/12/2019 0951   CO2 25 08/12/2019 0951   GLUCOSE 316 (H) 08/12/2019 0951   BUN 14 08/12/2019 0951   BUN 20 07/13/2016 1526   CREATININE 0.81 08/12/2019 0951   CREATININE 0.73 07/07/2016 1127   CALCIUM 8.4 (L) 08/12/2019 0951   GFRNONAA >60 08/12/2019 0951   GFRNONAA >89 07/07/2016 1127   GFRAA >60 08/12/2019 0951   GFRAA >89 07/07/2016 1127     ASSESSMENT: Abscess left groin most likely infection of left groin segment of aortobifem  Hyperglycemia most likely secondary to infection will check A1C   PLAN:  Incise and drain left groin today Will need to plan how to deal with graft over next few days if infected Seriousness of graft infection explained to pt and husband  Ruta Hinds, MD [facility] Office: [phone] Pager: [phone]

## 2019-08-12 NOTE — OR Nursing (Addendum)
Patient brought up from ED with patient belongings.  1 silver colored Timex watch with a white face was taken off and placed in denture cup and labeled with patient ID.  3 silver colored necklaces taken off and placed in denture cup:  1 necklace with a solid silver cross charm, 1 silver colored necklace with a heart charm, and 1 silver colored necklace with a silver colored round [facility] medallion.  All necklaces placed in denture cup and labeled with patient ID.  Both denture cups, purple flower cloth pocketbook and patient cellphone /wallet combo placed in belonging bag and labeled with patient ID.  Patient also hd a bag with clothing and shoes.  Both belonging bags and valuables taken to PACU with patient and given to Georgina Snell RN per [facility] RN.

## 2019-08-12 NOTE — Progress Notes (Signed)
Pharmacy Antibiotic Note  Olivia [name] is a 59 y.o. female admitted on 08/12/2019 with cellulitis.  Pharmacy has been consulted for vancomycin dosing. Pt with Tmax 100.4 and WBC is elevated at 13.1. SCr is WNL at 0.81 and lactic acid is elevated at 2.   Plan: Vancomycin 1500mg  IV x 1 then 1250mg  IV Q24H F/u renal fxn, C&S, clinical status and peak/trough at [facility] F/u continuation of gram neg coverage  Height: 5\' 3"  (160 cm) Weight: 180 lb (81.6 kg) IBW/kg (Calculated) : 52.4  Temp (24hrs), Avg:99.8 F (37.7 C), Min:99.2 F (37.3 C), Max:100.4 F (38 C)  Recent Labs  Lab 08/12/19 0951  WBC 13.1*  CREATININE 0.81  LATICACIDVEN 2.0*    Estimated Creatinine Clearance: 75.7 mL/min (by C-G formula based on SCr of 0.81 mg/dL).    Allergies  Allergen Reactions  . No Known Allergies     Antimicrobials this admission: Vanc 3/21>> CTX x 1 3/21  Dose adjustments this admission: N/A  Microbiology results: Pending  Thank you for allowing pharmacy to be a part of this patient's care.  Hagan Vanauken, 08/12/2019 11:42 AM

## 2019-08-12 NOTE — Anesthesia Preprocedure Evaluation (Addendum)
Anesthesia Evaluation  Patient identified by MRN, date of birth, ID band Patient awake    Reviewed: Allergy & Precautions, NPO status , Patient's Chart, lab work & pertinent test results  Airway Mallampati: II  TM Distance: >3 FB Neck ROM: Full    Dental  (+) Dental Advisory Given, Teeth Intact   Pulmonary Current Smoker and Patient abstained from smoking.,    breath sounds clear to auscultation       Cardiovascular hypertension, Pt. on medications and Pt. on home beta blockers + CAD, + Past MI and + Peripheral Vascular Disease   Rhythm:Regular Rate:Normal     Neuro/Psych negative neurological ROS     GI/Hepatic negative GI ROS, Neg liver ROS,   Endo/Other  diabetes, Type 2, Oral Hypoglycemic AgentsHypothyroidism   Renal/GU negative Renal ROS     Musculoskeletal  (+) Arthritis ,   Abdominal   Peds  Hematology negative hematology ROS (+)   Anesthesia Other Findings   Reproductive/Obstetrics                            Lab Results  Component Value Date   WBC 13.1 (H) 08/12/2019   HGB 15.5 (H) 08/12/2019   HCT 46.0 08/12/2019   MCV 90.9 08/12/2019   PLT 255 08/12/2019   Lab Results  Component Value Date   CREATININE 0.81 08/12/2019   BUN 14 08/12/2019   NA 132 (L) 08/12/2019   K 4.9 08/12/2019   CL 92 (L) 08/12/2019   CO2 25 08/12/2019    Anesthesia Physical Anesthesia Plan  ASA: III and emergent  Anesthesia Plan: General   Post-op Pain Management:    Induction: Intravenous  PONV Risk Score and Plan: 2 and Dexamethasone, Ondansetron and Treatment may vary due to age or medical condition  Airway Management Planned: Oral ETT  Additional Equipment:   Intra-op Plan:   Post-operative Plan: Extubation in OR  Informed Consent: I have reviewed the patients History and Physical, chart, labs and discussed the procedure including the risks, benefits and alternatives for the  proposed anesthesia with the patient or authorized representative who has indicated his/her understanding and acceptance.     Dental advisory given  Plan Discussed with: CRNA  Anesthesia Plan Comments:         Anesthesia Quick Evaluation

## 2019-08-12 NOTE — Transfer of Care (Signed)
Immediate Anesthesia Transfer of Care Note  Patient: Olivia [name]  Procedure(s) Performed: Incision and Drainage Left Groin (Left Groin)  Patient Location: PACU  Anesthesia Type:General  Level of Consciousness: awake, alert  and oriented  Airway & Oxygen Therapy: Patient Spontanous Breathing and Patient connected to nasal cannula oxygen  Post-op Assessment: Report given to RN and Post -op Vital signs reviewed and stable  Post vital signs: Reviewed and stable  Last Vitals:  Vitals Value Taken Time  BP 147/84 08/12/19 1756  Temp    Pulse 88 08/12/19 1802  Resp 19 08/12/19 1802  SpO2 88 % 08/12/19 1802  Vitals shown include unvalidated device data.  Last Pain:  Vitals:   08/12/19 1458  TempSrc: Oral  PainSc:          Complications: No apparent anesthesia complications

## 2019-08-13 LAB — CBC
HCT: 40.7 % (ref 36.0–46.0)
Hemoglobin: 13.4 g/dL (ref 12.0–15.0)
MCH: 30.2 pg (ref 26.0–34.0)
MCHC: 32.9 g/dL (ref 30.0–36.0)
MCV: 91.7 fL (ref 80.0–100.0)
Platelets: 228 10*3/uL (ref 150–400)
RBC: 4.44 MIL/uL (ref 3.87–5.11)
RDW: 13.3 % (ref 11.5–15.5)
WBC: 8.7 10*3/uL (ref 4.0–10.5)
nRBC: 0 % (ref 0.0–0.2)

## 2019-08-13 LAB — HIV ANTIBODY (ROUTINE TESTING W REFLEX): HIV Screen 4th Generation wRfx: NONREACTIVE

## 2019-08-13 LAB — GLUCOSE, CAPILLARY
Glucose-Capillary: 186 mg/dL — ABNORMAL HIGH (ref 70–99)
Glucose-Capillary: 87 mg/dL (ref 70–99)
Glucose-Capillary: 133 mg/dL — ABNORMAL HIGH (ref 70–99)
Glucose-Capillary: 174 mg/dL — ABNORMAL HIGH (ref 70–99)

## 2019-08-13 LAB — COMPREHENSIVE METABOLIC PANEL
ALT: 11 U/L (ref 0–44)
AST: 14 U/L — ABNORMAL LOW (ref 15–41)
Albumin: 2.3 g/dL — ABNORMAL LOW (ref 3.5–5.0)
Alkaline Phosphatase: 77 U/L (ref 38–126)
Anion gap: 11 (ref 5–15)
BUN: 12 mg/dL (ref 6–20)
CO2: 28 mmol/L (ref 22–32)
Calcium: 7.9 mg/dL — ABNORMAL LOW (ref 8.9–10.3)
Chloride: 97 mmol/L — ABNORMAL LOW (ref 98–111)
Creatinine, Ser: 0.78 mg/dL (ref 0.44–1.00)
GFR calc Af Amer: >60 mL/min (ref >60)
GFR calc non Af Amer: >60 mL/min (ref >60)
Glucose, Bld: 248 mg/dL — ABNORMAL HIGH (ref 70–99)
Potassium: 4.4 mmol/L (ref 3.5–5.1)
Sodium: 136 mmol/L (ref 135–145)
Total Bilirubin: 0.7 mg/dL (ref 0.3–1.2)
Total Protein: 6 g/dL — ABNORMAL LOW (ref 6.5–8.1)

## 2019-08-13 LAB — HEMOGLOBIN A1C
Hgb A1c MFr Bld: 12 % — ABNORMAL HIGH (ref 4.8–5.6)
Mean Plasma Glucose: 297.7 mg/dL

## 2019-08-13 MED ORDER — INSULIN STARTER KIT- PEN NEEDLES (ENGLISH)
1.0000 | Freq: Once | Status: AC
  Administered 2019-08-14: 10:00:00 1
  Filled 2019-08-13: qty 1

## 2019-08-13 MED ORDER — INSULIN ASPART 100 UNIT/ML ~~LOC~~ SOLN
0.0000 [IU] | Freq: Three times a day (TID) | SUBCUTANEOUS | Status: DC
  Administered 2019-08-13: 17:00:00 2 [IU] via SUBCUTANEOUS
  Administered 2019-08-14: 07:00:00 3 [IU] via SUBCUTANEOUS
  Administered 2019-08-15: 08:00:00 5 [IU] via SUBCUTANEOUS

## 2019-08-13 MED ORDER — INSULIN ASPART 100 UNIT/ML ~~LOC~~ SOLN: 0.0000 [IU] | Freq: Every day | SUBCUTANEOUS | Status: DC

## 2019-08-13 NOTE — Progress Notes (Signed)
Pt transferred from PACU, appeared alert and oriented x 4. Left groin welling pink,soft touched, negative for hematoma, with original dressing dry and clean, no bleeding, denied pain. Ambulated well in room with one standby assisted.  CCMD called and 2nd person verified her sinus rhythm on monitor,  BP 98/55-135/68 mmHg,  HR 70s-90s.  RR 17- 20,  SPO2 94-98% on 3 LPM of NCL, wean off O2 NCL,  room air SPO2 96%. Temp 97.9- 100.5 F orally.   CHG bath given, cal bell within reach, room equipments, visitor policy instructions given.   No immediate distress note. Will continue to morniotor.  Filiberto Pinks, RN

## 2019-08-13 NOTE — Progress Notes (Signed)
RN in room to administer medications, pt family in room.  While scanning meds, turned around to ask pt question and noticed a large puff of smoke coming out of her mouth.  She was using a vape pen and seemed surprised when I told her that she absolutely could not use it.  She was educated about the dangers of vapeing and how it could interfere with her body's healing process.  Her son was asked to take the pen home so that she would not be tempted to use again.  Nicotine patch in place from this morning.

## 2019-08-13 NOTE — Progress Notes (Addendum)
Inpatient Diabetes Program Recommendations  AACE/ADA: New Consensus Statement on Inpatient Glycemic Control (2015)  Target Ranges:  Prepandial:   less than 140 mg/dL      Peak postprandial:   less than 180 mg/dL (1-2 hours)      Critically ill patients:  140 - 180 mg/dL   Lab Results  Component Value Date   GLUCAP 87 08/13/2019   HGBA1C 12.0 (H) 08/13/2019    Review of Glycemic Control Results for CHARDAI, GANGEMI (MRN [id number]) as of 08/13/2019 14:07  Ref. Range 08/12/2019 17:58 08/12/2019 21:16 08/13/2019 06:24 08/13/2019 12:24  Glucose-Capillary Latest Ref Range: 70 - 99 mg/dL 191 (H) 206 (H) 186 (H) 87   Diabetes history: Type 2 DM Outpatient Diabetes medications: Metformin 1000 mg BID, Januvia 25 mg QD Current orders for Inpatient glycemic control: Glipizide 10 mg QAM, Invokana 100 mg QAM, Novolog 0-15 units TID, Novolog 0-5 units QHS, Tradjenta 5 mg QD  Inpatient Diabetes Program Recommendations:    Spoke with patient regarding diabetes management. Patient verified home medications and has never been on insulin. Has been fearful of insulin given experiences with family. Reviewed patient's current A1c of 12.0%. Explained what a A1c is and what it measures. Also reviewed goal A1c with patient, importance of good glucose control @ home, and blood sugar goals. Reviewed patho of DM, need for insulin, role of pancreas, vascular changes, and comorbidites.  Patient has a meter and checks 3 times per day.  Denies drinking sugary beverages but admits to struggling with portion control. Reviewed importance of eliminating additional sugar from diet, plate method, and encouraged portion mindfulness. Will place dietitian consult.  Discussed family experiences and patient fears regarding insulin use. Patient is willing to use insulin. If trends continue to exceed 180 mg/dL Recommend adding Levemir 8 units QD.   Encouraged to follow up with PCP in the nest few weeks and reviewed when to call MD.   Educated patient on insulin pen use at home, in the event need for insulin at discharge. Reviewed contents of insulin flexpen starter kit. Reviewed all steps if insulin pen including attachment of needle, 2-unit air shot, dialing up dose, giving injection, removing needle, disposal of sharps, storage of unused insulin, disposal of insulin etc. Patient able to provide successful return demonstration. Also reviewed troubleshooting with insulin pen. MD to give patient Rxs for insulin pens and insulin pen needles. Preferred insulin for 30 day supply:  -Lantus Solostar insulin pen ($50) -Tyler Aas Flex pen ($50)   Thanks,  Bronson Curb, MSN, RNC-OB Diabetes Coordinator [phone] (8a-5p)

## 2019-08-13 NOTE — Progress Notes (Addendum)
  Progress Note    08/13/2019 7:30 AM 1 Day Post-Op  Subjective:  States she feels great this morning. No pain   Vitals:   08/13/19 0200 08/13/19 0323  BP: (!) 98/55 104/62  Pulse: 67 70  Resp: 17 20  Temp:  97.9 F (36.6 C)  SpO2: 98% 97%   Physical Exam: General: well appearing, not in any discomfort, eating breakfast Lungs:  Non labored Incisions:  Left groin incision, with surrounding induration and erythema, deep wound, no purulent drainage, healthy tissue in wound bed, packing wet-to dry dressings reapplied Extremities:  Bilateral lower extremities well perfused, warm. Motor and sensory intact. Brisk left DP/PT/ peroneal Abdomen:  Soft, non tender Neurologic: Alert and oriented  CBC    Component Value Date/Time   WBC 8.7 08/13/2019 0320   RBC 4.44 08/13/2019 0320   HGB 13.4 08/13/2019 0320   HCT 40.7 08/13/2019 0320   PLT 228 08/13/2019 0320   MCV 91.7 08/13/2019 0320   MCH 30.2 08/13/2019 0320   MCHC 32.9 08/13/2019 0320   RDW 13.3 08/13/2019 0320   LYMPHSABS 1.0 08/12/2019 0951   MONOABS 1.0 08/12/2019 0951   EOSABS 0.0 08/12/2019 0951   BASOSABS 0.1 08/12/2019 0951    BMET    Component Value Date/Time   NA 136 08/13/2019 0320   NA 137 07/13/2016 1526   K 4.4 08/13/2019 0320   CL 97 (L) 08/13/2019 0320   CO2 28 08/13/2019 0320   GLUCOSE 248 (H) 08/13/2019 0320   BUN 12 08/13/2019 0320   BUN 20 07/13/2016 1526   CREATININE 0.78 08/13/2019 0320   CREATININE 0.73 07/07/2016 1127   CALCIUM 7.9 (L) 08/13/2019 0320   GFRNONAA >60 08/13/2019 0320   GFRNONAA >89 07/07/2016 1127   GFRAA >60 08/13/2019 0320   GFRAA >89 07/07/2016 1127    INR    Component Value Date/Time   INR 1.10 08/18/2016 1430     Intake/Output Summary (Last 24 hours) at 08/13/2019 0730 Last data filed at 08/13/2019 0600 Gross per 24 hour  Intake 1250 ml  Output 12 ml  Net 1238 ml     Assessment/Plan:  59 y.o. female is s/p I&D of left groin abscess 1 Day Post-Op. No  exposed graft seen in OR on debridement. Left groin looking good. No purulence. Wound repacked. Afebrile. Wound Moderate gram positive cocci on gram stain, culture pending. Blood Cultures pending. On Rocephin and Vanomycin. Hypotensive overnight. Hold BP medications this morning. Tolerating diet. Ambulate today.   DVT prophylaxis: Lovenox   Graceann Congress, PA-C [facility] [phone] 08/13/2019 7:30 AM   I agree with the above.  I have seen and evaluated the patient.  Her dressing was recently changed so I did not change it.  She will be continued on IV antibiotics.  Cultures are pending.  I told her she would likely be in the hospital several days until we are sure that her graft is not involved with this infection.  Durene Cal

## 2019-08-14 LAB — GLUCOSE, CAPILLARY
Glucose-Capillary: 178 mg/dL — ABNORMAL HIGH (ref 70–99)
Glucose-Capillary: 114 mg/dL — ABNORMAL HIGH (ref 70–99)
Glucose-Capillary: 112 mg/dL — ABNORMAL HIGH (ref 70–99)
Glucose-Capillary: 158 mg/dL — ABNORMAL HIGH (ref 70–99)

## 2019-08-14 NOTE — Progress Notes (Addendum)
  Progress Note    08/14/2019 7:29 AM 2 Days Post-Op  Subjective:  No complaints over night. No pain  Vitals:   08/13/19 2028 08/14/19 0400  BP: 102/76 114/75  Pulse: 91 77  Resp: 12 19  Temp: 100.3 F (37.9 C) 98.6 F (37 C)  SpO2: 98% 95%   Physical Exam: General: well appearing, well nourished, looks comfortable Lungs:  Non labored Incisions: left groin wound well appearing, healthy wound bed, no drainage. Minimal bleeding. Still surrounding induration and erythema. Packing removed and redressed Extremities: 2+ bilateral femoral pulses, bilateral lower extremities well perfused. Bilateral feet with Brisk DP/PT/Pero doppler signals. Both feet warm. Motor and sensory intact Abdomen:  Obese, soft, non tender Neurologic:Alert and oriented    CBC    Component Value Date/Time   WBC 8.7 08/13/2019 0320   RBC 4.44 08/13/2019 0320   HGB 13.4 08/13/2019 0320   HCT 40.7 08/13/2019 0320   PLT 228 08/13/2019 0320   MCV 91.7 08/13/2019 0320   MCH 30.2 08/13/2019 0320   MCHC 32.9 08/13/2019 0320   RDW 13.3 08/13/2019 0320   LYMPHSABS 1.0 08/12/2019 0951   MONOABS 1.0 08/12/2019 0951   EOSABS 0.0 08/12/2019 0951   BASOSABS 0.1 08/12/2019 0951    BMET    Component Value Date/Time   NA 136 08/13/2019 0320   NA 137 07/13/2016 1526   K 4.4 08/13/2019 0320   CL 97 (L) 08/13/2019 0320   CO2 28 08/13/2019 0320   GLUCOSE 248 (H) 08/13/2019 0320   BUN 12 08/13/2019 0320   BUN 20 07/13/2016 1526   CREATININE 0.78 08/13/2019 0320   CREATININE 0.73 07/07/2016 1127   CALCIUM 7.9 (L) 08/13/2019 0320   GFRNONAA >60 08/13/2019 0320   GFRNONAA >89 07/07/2016 1127   GFRAA >60 08/13/2019 0320   GFRAA >89 07/07/2016 1127    INR    Component Value Date/Time   INR 1.10 08/18/2016 1430    No intake or output data in the 24 hours ending 08/14/19 0729   Assessment/Plan:  59 y.o. female is s/p I&D left groin abscess 2 Days Post-Op. Wound well appearing without necrosis or  drainage. Continue packing changes. Low grade fever over night. Wound culture positive for Staph Aureus sensative to Vanc. Will discuss with Dr. Myra Gianotti d/c Rocephin. Continue both IV antibiotics for now. Will continue to monitor wound and signs of possible graft involvement   DVT prophylaxis: Lovenox   Graceann Congress, PA-C [facility] [phone] 08/14/2019 7:29 AM   I agree with the above.  Have seen and examined the patient.  We will continue with dressing changes.  Her cultures grew out staph aureus and her antibiotics have been adjusted.  We will keep her in the hospital until the end of the week to make sure that she does not show any signs of graft infection.  Durene Cal

## 2019-08-14 NOTE — Progress Notes (Signed)
MOBILITY TEAM - Progress Note   08/14/19 1351  Mobility  Activity Ambulated in hall  Level of Assistance Modified independent, requires aide device or extra time  Assistive Device None  Distance Ambulated (ft) 450 ft  Mobility Response Tolerated well  Mobility performed by Mobility specialist  Bed Position  (seated edge of bed)   Patient independent with ambulation, able to manage IV pole; encouraged more frequent hallway ambulation.  Ina Homes, PT, DPT Mobility Team Pager [phone]

## 2019-08-14 NOTE — Progress Notes (Signed)
MOBILITY TEAM - Progress Note   08/14/19 1114  Mobility  Activity Ambulated in hall  Level of Assistance Standby assist, set-up cues, supervision of patient - no hands on  Assistive Device None  Distance Ambulated (ft) 350 ft  Mobility Response Tolerated well  Mobility performed by Mobility specialist  Bed Position Chair   Slight unsteadiness with ambulation, but pt able to self-correct. Post-mobility BP 111/63, HR 74-79; pt asymptomatic.  Ina Homes, PT, DPT Mobility Team Pager [phone]

## 2019-08-15 LAB — GLUCOSE, CAPILLARY
Glucose-Capillary: 284 mg/dL — ABNORMAL HIGH (ref 70–99)
Glucose-Capillary: 215 mg/dL — ABNORMAL HIGH (ref 70–99)
Glucose-Capillary: 71 mg/dL (ref 70–99)
Glucose-Capillary: 136 mg/dL — ABNORMAL HIGH (ref 70–99)
Glucose-Capillary: 148 mg/dL — ABNORMAL HIGH (ref 70–99)

## 2019-08-15 MED ORDER — CEFAZOLIN SODIUM-DEXTROSE 1-4 GM/50ML-% IV SOLN
1.0000 g | Freq: Three times a day (TID) | INTRAVENOUS | Status: DC
  Administered 2019-08-15 – 2019-08-17 (×5): 1 g via INTRAVENOUS
  Filled 2019-08-15 (×6): qty 50

## 2019-08-15 MED ORDER — METFORMIN HCL 500 MG PO TABS
1000.0000 mg | ORAL_TABLET | Freq: Two times a day (BID) | ORAL | Status: DC
  Administered 2019-08-15 – 2019-08-17 (×4): 1000 mg via ORAL
  Filled 2019-08-15 (×4): qty 2

## 2019-08-15 NOTE — Progress Notes (Addendum)
  Progress Note    08/15/2019 10:06 AM 3 Days Post-Op  Subjective:  No complaints. States she is hopeful to go home tomorrow   Vitals:   08/15/19 0809 08/15/19 0812  BP: (!) 168/96   Pulse: 74 74  Resp: (!) 26   Temp:    SpO2: 98%    Physical Exam: General: appears comfortable, well nourished Lungs:  Non labored Incisions:  Left groin incision healing well, healthy tissue in wound bed, no drainage. Surrounding induration medial and proximal to wound. Packing replaced and dry dressings applied.       Extremities:  Bilateral lower extremities well perfused and warm. Brisk DP/ PT/ Pero signals bilaterally Abdomen: obese, soft, non tender Neurologic: Alert and oriented  CBC    Component Value Date/Time   WBC 8.7 08/13/2019 0320   RBC 4.44 08/13/2019 0320   HGB 13.4 08/13/2019 0320   HCT 40.7 08/13/2019 0320   PLT 228 08/13/2019 0320   MCV 91.7 08/13/2019 0320   MCH 30.2 08/13/2019 0320   MCHC 32.9 08/13/2019 0320   RDW 13.3 08/13/2019 0320   LYMPHSABS 1.0 08/12/2019 0951   MONOABS 1.0 08/12/2019 0951   EOSABS 0.0 08/12/2019 0951   BASOSABS 0.1 08/12/2019 0951    BMET    Component Value Date/Time   NA 136 08/13/2019 0320   NA 137 07/13/2016 1526   K 4.4 08/13/2019 0320   CL 97 (L) 08/13/2019 0320   CO2 28 08/13/2019 0320   GLUCOSE 248 (H) 08/13/2019 0320   BUN 12 08/13/2019 0320   BUN 20 07/13/2016 1526   CREATININE 0.78 08/13/2019 0320   CREATININE 0.73 07/07/2016 1127   CALCIUM 7.9 (L) 08/13/2019 0320   GFRNONAA >60 08/13/2019 0320   GFRNONAA >89 07/07/2016 1127   GFRAA >60 08/13/2019 0320   GFRAA >89 07/07/2016 1127    INR    Component Value Date/Time   INR 1.10 08/18/2016 1430     Intake/Output Summary (Last 24 hours) at 08/15/2019 1006 Last data filed at 08/15/2019 4403 Gross per 24 hour  Intake 1235.17 ml  Output --  Net 1235.17 ml     Assessment/Plan:  59 y.o. female is s/p I&D left groin abscess 3 Days Post-Op . Wound continues to  appear healthy. Continue packing changes. Afebrile. Continue IV antibiotics- Pharmacy recommending narrowing to Cefazolin, which I will discuss with Dr. Myra Gianotti. Mobilizing well. Will continue to monitor for signs of graft infection  DVT prophylaxis:  Lovenox   Graceann Congress, PA-C [facility] [phone] 08/15/2019 10:06 AM   I agree with the above.  I have seen and evaluated the patient.  I removed her dressing today and inspected the wound which appears to be healing appropriately.  We will tailor her antibiotics and encourage ambulation.  Hopefully she can be discharged home on Friday with 6 weeks of antibiotics  [name] Aris Even

## 2019-08-15 NOTE — Progress Notes (Signed)
Order for metformin were placed by PAC. Keva Darty, Randall An RN

## 2019-08-15 NOTE — Progress Notes (Addendum)
Patient concerned that she is not getting metformin while in hospital. Reviewed current medication with patient. Corrina Baglia  PAC paged to make aware will await call back. Ravinder Lukehart, Randall An RN

## 2019-08-15 NOTE — Progress Notes (Signed)
Pharmacy Antibiotic Note  Olivia [name] is a 59 y.o. female admitted on 08/12/2019 with groin abscess now s/p I&D with abscess Cx growing MSSA.  Currently on vanc+ ctx and to narrow to cefazolin.    Plan: Cefazolin 1g IV every 8 hours Monitor renal function, LOT  Height: 5\' 3"  (160 cm) Weight: 180 lb (81.6 kg) IBW/kg (Calculated) : 52.4  Temp (24hrs), Avg:99.1 F (37.3 C), Min:98.7 F (37.1 C), Max:99.4 F (37.4 C)  Recent Labs  Lab 08/12/19 0951 08/12/19 1146 08/13/19 0320  WBC 13.1*  --  8.7  CREATININE 0.81  --  0.78  LATICACIDVEN 2.0* 2.0*  --     Estimated Creatinine Clearance: 76.6 mL/min (by C-G formula based on SCr of 0.78 mg/dL).    No Known Allergies  Antimicrobials this admission: Vanc 3/21>>3/24 CTX 3/21>>3/24 Cefazolin 3/24>>  Dose adjustments this admission: n/a  Microbiology results: 3/21 wound - GPC>mssa 3/21 bld x2 - ngtd  4/21, PharmD Clinical Pharmacist Please check AMION for all [facility] Pharmacy numbers 08/15/2019 12:59 PM

## 2019-08-15 NOTE — Progress Notes (Signed)
Inpatient Diabetes Program Recommendations  AACE/ADA: New Consensus Statement on Inpatient Glycemic Control (2015)  Target Ranges:  Prepandial:   less than 140 mg/dL      Peak postprandial:   less than 180 mg/dL (1-2 hours)      Critically ill patients:  140 - 180 mg/dL   Lab Results  Component Value Date   GLUCAP 71 08/15/2019   HGBA1C 12.0 (H) 08/13/2019    Review of Glycemic Control  Results for Olivia [name], Olivia [name] (MRN [id number]) as of 08/15/2019 11:51  Ref. Range 08/14/2019 06:09 08/14/2019 11:31 08/14/2019 16:31 08/14/2019 21:30 08/15/2019 06:03 08/15/2019 08:23 08/15/2019 11:09  Glucose-Capillary Latest Ref Range: 70 - 99 mg/dL 401 (H) 027 (H) 253 (H) 158 (H) 284 (H) 215 (H) 71   Diabetes history: Type 2 DM Outpatient Diabetes medications: Metformin 1000 mg BID, Januvia 25 mg QD Current orders for Inpatient glycemic control: Glipizide 10 mg QAM, Invokana 100 mg QAM, Novolog 0-15 units TID, Novolog 0-5 units QHS, Tradjenta 5 mg QD  Inpatient Diabetes Program Recommendations:    Based on fasting glucose > 200 and lunchtime glucose being 71. Consider reducing Glipizide to 5 mg and change to bid instead of daily to cover fasting glucose.   MD to give patient Rxs for insulin pens and insulin pen needles. Preferred insulin for 30 day supply:  -Lantus Solostar insulin pen ($50) -Evaristo Bury Flex pen ($50)   Thanks,  Christena Deem RN, MSN, BC-ADM Inpatient Diabetes Coordinator Team Pager [phone] (8a-5p)

## 2019-08-16 LAB — GLUCOSE, CAPILLARY
Glucose-Capillary: 170 mg/dL — ABNORMAL HIGH (ref 70–99)
Glucose-Capillary: 159 mg/dL — ABNORMAL HIGH (ref 70–99)
Glucose-Capillary: 91 mg/dL (ref 70–99)
Glucose-Capillary: 144 mg/dL — ABNORMAL HIGH (ref 70–99)

## 2019-08-16 NOTE — TOC Initial Note (Signed)
Transition of Care (TOC) - Initial/Assessment Note  Marvetta Gibbons RN, BSN Transitions of Care Unit 4E- RN Case Manager [phone]   Patient Details  Name: Olivia [name] MRN: [id number] Date of Birth: 10/29/1960  Transition of Care [facility]) CM/SW Contact:    Dawayne Patricia, RN Phone Number: 08/16/2019, 3:12 PM  Clinical Narrative:                 Pt s/p I&D of groin infection, order placed for [facility] needs. CM spoke with pt at bedside- pt home with spouse. Pt reports spouse can assist with wound care. Pt reports plan to go home on po abx for 6 wks. Address, phone # and PCP confirmed with pt- pt states she no longer sees PCP listed in epic- she is going to Dr. Jenean Lindau at the [facility] in [place].  Discussed HHRN need for wound care- list provided for [facility] choice Per CMS guidelines from medicare.gov website with star ratings (copy placed in shadow chart) - pt reports she is not familiar with any HH agencies in [place] as she and husband relocated recently. Pt states she does not have a choice- and gives CM permission to find and agency on her behalf that can provide services and is in network with her insurance.  Call made to [facility]- they are OON for services-  Call made to [facility]- spoke with Danae Chen- who states they are in -network with insurance and can accept referral with a potential start of care over the weekend if pt discharges tomorrow. Orders, demographics and H&P faxed to [facility] via epic to fax # [phone] Will contact [facility] tomorrow to confirm receipt of paperwork and pt's discharge date.    Expected Discharge Plan: [place] Barriers to Discharge: Continued Medical Work up   Patient Goals and CMS Choice Patient states their goals for this hospitalization and ongoing recovery are:: to heal and get better- CMS Medicare.gov Compare Post Acute Care list provided to:: Patient Choice offered to / list presented to : Patient  Expected  Discharge Plan and Services Expected Discharge Plan: [place]   Discharge Planning Services: CM Consult Post Acute Care Choice: Home Health                             HH Arranged: RN [facility] Agency: Other - See comment([facility]) Date [facility] Agency Contacted: 08/16/19 Time HH Agency Contacted: 1400 Representative spoke with at [place]: Danae Chen  Prior Living Arrangements/Services   Lives with:: Self, Spouse Patient language and need for interpreter reviewed:: Yes Do you feel safe going back to the place where you live?: Yes      Need for Family Participation in Patient Care: Yes (Comment) Care giver support system in place?: Yes (comment)   Criminal Activity/Legal Involvement Pertinent to Current Situation/Hospitalization: No - Comment as needed  Activities of Daily Living Home Assistive Devices/Equipment: CBG Meter ADL Screening (condition at time of admission) Patient's cognitive ability adequate to safely complete daily activities?: Yes Is the patient deaf or have difficulty hearing?: No Does the patient have difficulty seeing, even when wearing glasses/contacts?: No Does the patient have difficulty concentrating, remembering, or making decisions?: No Patient able to express need for assistance with ADLs?: Yes Does the patient have difficulty dressing or bathing?: No Independently performs ADLs?: Yes (appropriate for developmental age) Does the patient have difficulty walking or climbing stairs?: No Weakness of Legs:  None Weakness of Arms/Hands: None  Permission Sought/Granted Permission sought to share information with : Oceanographer granted to share information with : Yes, Verbal Permission Granted     Permission granted to share info w AGENCY: HH agencies        Emotional Assessment Appearance:: Appears stated age Attitude/Demeanor/Rapport: Engaged Affect (typically observed): Appropriate,  Pleasant Orientation: : Oriented to Self, Oriented to Place, Oriented to  Time Alcohol / Substance Use: Not Applicable Psych Involvement: No (comment)  Admission diagnosis:  Inguinal abscess [L02.214] Abscess of left groin [L02.214] Patient Active Problem List   Diagnosis Date Noted  . Abscess of left groin 08/12/2019  . Aortoiliac occlusive disease (HCC) 08/18/2016  . Right-sided extracranial carotid artery stenosis 07/23/2016  . Left-sided carotid artery disease (HCC) 05/04/2016  . Claudication (HCC) 05/04/2016  . Coronary artery disease due to lipid rich plaque 05/04/2016  . Essential hypertension 05/04/2016  . Hyperlipidemia 05/04/2016  . Diabetes (HCC) 05/04/2016  . Well woman exam with routine gynecological exam 11/11/2014  . HNP (herniated nucleus pulposus), lumbar 06/28/2014   PCP:  No primary care provider on file. Pharmacy:   [facility] [address] [place] - [address] [place] [address] Phone: [phone] Fax: [phone]     Social Determinants of Health (SDOH) Interventions    Readmission Risk Interventions Readmission Risk Prevention Plan 08/16/2019  Post Dischage Appt Complete  Medication Screening Complete  Transportation Screening Complete  Some recent data might be hidden

## 2019-08-16 NOTE — Plan of Care (Signed)
  Problem: Education: Goal: Knowledge of General Education information will improve Description Including pain rating scale, medication(s)/side effects and non-pharmacologic comfort measures Outcome: Progressing   Problem: Health Behavior/Discharge Planning: Goal: Ability to manage health-related needs will improve Outcome: Progressing   

## 2019-08-16 NOTE — Progress Notes (Signed)
MOBILITY TEAM - Progress Note   08/16/19 0800  Mobility  Activity Ambulated in hall  Level of Assistance Independent  Assistive Device None  Distance Ambulated (ft) 480 ft  Mobility Response Tolerated well  Mobility performed by Mobility specialist    Ina Homes, PT, DPT Mobility Team Pager [phone]

## 2019-08-16 NOTE — Progress Notes (Signed)
MOBILITY TEAM - Progress Note   08/16/19 1616  Mobility  Activity Ambulated in hall  Level of Assistance Independent  Assistive Device  (IV pole)  Distance Ambulated (ft) 470 ft  Mobility Response Tolerated well  Mobility performed by Mobility specialist  Bed Position Chair    Ina Homes, PT, DPT Mobility Team Pager [phone]

## 2019-08-16 NOTE — Progress Notes (Signed)
    Subjective  - POD #4  No complaints   Physical Exam:  Dressing changed, wound healing appropriately, no graft exposed       Assessment/Plan:  POD #4  Anticipate discharge home tomorrow with [facility] for dressing changes.  PO abx for 6 weeks  [name] Brabham 08/16/2019 6:02 PM --  Vitals:   08/16/19 1155 08/16/19 1541  BP: (!) 137/91 132/86  Pulse: 68 77  Resp: 14 17  Temp: 98.6 F (37 C) (!) 100.8 F (38.2 C)  SpO2:  98%    Intake/Output Summary (Last 24 hours) at 08/16/2019 1802 Last data filed at 08/16/2019 1512 Gross per 24 hour  Intake 916.42 ml  Output -  Net 916.42 ml     Laboratory CBC    Component Value Date/Time   WBC 8.7 08/13/2019 0320   HGB 13.4 08/13/2019 0320   HCT 40.7 08/13/2019 0320   PLT 228 08/13/2019 0320    BMET    Component Value Date/Time   NA 136 08/13/2019 0320   NA 137 07/13/2016 1526   K 4.4 08/13/2019 0320   CL 97 (L) 08/13/2019 0320   CO2 28 08/13/2019 0320   GLUCOSE 248 (H) 08/13/2019 0320   BUN 12 08/13/2019 0320   BUN 20 07/13/2016 1526   CREATININE 0.78 08/13/2019 0320   CREATININE 0.73 07/07/2016 1127   CALCIUM 7.9 (L) 08/13/2019 0320   GFRNONAA >60 08/13/2019 0320   GFRNONAA >89 07/07/2016 1127   GFRAA >60 08/13/2019 0320   GFRAA >89 07/07/2016 1127    COAG Lab Results  Component Value Date   INR 1.10 08/18/2016   INR 0.85 08/17/2016   INR 0.88 07/22/2016   No results found for: PTT  Antibiotics Anti-infectives (From admission, onward)   Start     Dose/Rate Route Frequency Ordered Stop   08/15/19 2200  ceFAZolin (ANCEF) IVPB 1 g/50 mL premix     1 g 100 mL/hr over 30 Minutes Intravenous Every 8 hours 08/15/19 1301     08/13/19 1200  vancomycin (VANCOREADY) IVPB 1250 mg/250 mL  Status:  Discontinued     1,250 mg 166.7 mL/hr over 90 Minutes Intravenous Every 24 hours 08/12/19 1142 08/15/19 1300   08/13/19 1200  cefTRIAXone (ROCEPHIN) 1 g in sodium chloride 0.9 % 100 mL IVPB  Status:  Discontinued      1 g 200 mL/hr over 30 Minutes Intravenous Every 24 hours 08/12/19 1745 08/15/19 1255   08/12/19 1145  vancomycin (VANCOREADY) IVPB 1500 mg/300 mL     1,500 mg 150 mL/hr over 120 Minutes Intravenous  Once 08/12/19 1130 08/12/19 1544   08/12/19 1130  vancomycin (VANCOCIN) IVPB 1000 mg/200 mL premix  Status:  Discontinued     1,000 mg 200 mL/hr over 60 Minutes Intravenous  Once 08/12/19 1127 08/12/19 1130   08/12/19 1130  cefTRIAXone (ROCEPHIN) 2 g in sodium chloride 0.9 % 100 mL IVPB     2 g 200 mL/hr over 30 Minutes Intravenous  Once 08/12/19 1127 08/12/19 1340       V. Charlena Cross, M.D., [facility] [place] Office: [phone] Pager:  [phone]

## 2019-08-17 LAB — CULTURE, BLOOD (ROUTINE X 2)
Culture: NO GROWTH
Culture: NO GROWTH
Special Requests: ADEQUATE
Special Requests: ADEQUATE

## 2019-08-17 LAB — GLUCOSE, CAPILLARY: Glucose-Capillary: 144 mg/dL — ABNORMAL HIGH (ref 70–99)

## 2019-08-17 LAB — AEROBIC/ANAEROBIC CULTURE W GRAM STAIN (SURGICAL/DEEP WOUND)

## 2019-08-17 MED ORDER — OXYCODONE-ACETAMINOPHEN 7.5-325 MG PO TABS: 1.0000 | ORAL_TABLET | ORAL | 0 refills | Status: AC | PRN

## 2019-08-17 MED ORDER — CEPHALEXIN 500 MG PO CAPS: 500.0000 mg | ORAL_CAPSULE | Freq: Four times a day (QID) | ORAL | 0 refills | Status: AC

## 2019-08-17 NOTE — TOC Transition Note (Signed)
Transition of Care [facility]) - CM/SW Discharge Note Donn Pierini RN, BSN Transitions of Care Unit 4E- RN Case Manager [phone]   Patient Details  Name: Olivia [name] MRN: [id number] Date of Birth: 01-19-1961  Transition of Care [facility]) CM/SW Contact:  Darrold Span, RN Phone Number: 08/17/2019, 2:01 PM   Clinical Narrative:    Pt stable for transition home today, call made to [facility] [phone]) to confirm receipt of fax from yesterday and to notify of discharge today. Fax was not received so refaxed needed documents again via epic- to [phone]- have confirmed documents received this time. Spoke with pt to let her know [facility] agency that was secured and that they will be contacting her in the next 48 hr to arrange start of care visit.    Final next level of care: Home w Home Health Services Barriers to Discharge: Barriers Resolved   Patient Goals and CMS Choice Patient states their goals for this hospitalization and ongoing recovery are:: to heal and get better- CMS Medicare.gov Compare Post Acute Care list provided to:: Patient Choice offered to / list presented to : Patient  Discharge Placement                 Home with [facility]      Discharge Plan and Services   Discharge Planning Services: CM Consult Post Acute Care Choice: Home Health            DME Agency: NA       HH Arranged: RN HH Agency: Other - See comment([facility]) Date [facility] Agency Contacted: 08/16/19 Time HH Agency Contacted: 1400 Representative spoke with at [facility] Agency: Alcario Drought  Social Determinants of Health (SDOH) Interventions     Readmission Risk Interventions Readmission Risk Prevention Plan 08/16/2019  Post Dischage Appt Complete  Medication Screening Complete  Transportation Screening Complete  Some recent data might be hidden

## 2019-08-17 NOTE — Progress Notes (Signed)
Pt provided discharge instructions and education. Pt IV removed and intact. Telebox removed/ccmd notified. Pt vitals stable. Pt denies complaints. Pt has wound supplies for home. Pt awaiting ride. Will be tx via wheelchair to valet to meet family.  Lacy Duverney, RN

## 2019-08-17 NOTE — Discharge Instructions (Signed)
Skin Abscess  A skin abscess is an infected area of your skin that contains pus and other material. An abscess can happen in any part of your body. Some abscesses break open (rupture) on their own. Most continue to get worse unless they are treated. The infection can spread deeper into the body and into your blood, which can make you feel sick. A skin abscess is caused by germs that enter the skin through a cut or scrape. It can also be caused by blocked oil and sweat glands or infected hair follicles. This condition is usually treated by:  Draining the pus.  Taking antibiotic medicines.  Placing a warm, wet washcloth over the abscess. Follow these instructions at home: Medicines   Take over-the-counter and prescription medicines only as told by your doctor.  If you were prescribed an antibiotic medicine, take it as told by your doctor. Do not stop taking the antibiotic even if you start to feel better. Abscess care   If you have an abscess that has not drained, place a warm, clean, wet washcloth over the abscess several times a day. Do this as told by your doctor.  Follow instructions from your doctor about how to take care of your abscess. Make sure you: ? Cover the abscess with a bandage (dressing). ? Change your bandage or gauze as told by your doctor. ? Wash your hands with soap and water before you change the bandage or gauze. If you cannot use soap and water, use hand sanitizer.  Check your abscess every day for signs that the infection is getting worse. Check for: ? More redness, swelling, or pain. ? More fluid or blood. ? Warmth. ? More pus or a bad smell. General instructions  To avoid spreading the infection: ? Do not share personal care items, towels, or hot tubs with others. ? Avoid making skin-to-skin contact with other people.  Keep all follow-up visits as told by your doctor. This is important. Contact a doctor if:  You have more redness, swelling, or  pain around your abscess.  You have more fluid or blood coming from your abscess.  Your abscess feels warm when you touch it.  You have more pus or a bad smell coming from your abscess.  You have a fever.  Your muscles ache.  You have chills.  You feel sick. Get help right away if:  You have very bad (severe) pain.  You see red streaks on your skin spreading away from the abscess. Summary  A skin abscess is an infected area of your skin that contains pus and other material.  The abscess is caused by germs that enter the skin through a cut or scrape. It can also be caused by blocked oil and sweat glands or infected hair follicles.  Follow your doctor's instructions on caring for your abscess, taking medicines, preventing infections, and keeping follow-up visits. This information is not intended to replace advice given to you by your health care provider. Make sure you discuss any questions you have with your health care provider. Document Revised: 12/14/2018 Document Reviewed: 06/23/2017 Elsevier Patient Education  2020 [employer]. Work towards checking blood sugars 2-3 times per day (when you first get wake up, when you get home from shift, and aim for 2 hours after meal). Take values with you to next PCP appointment  (2 weeks following hospitalization).  Focus on eliminating sugary beverages and on portion control, "remember size of palm of hand"  Preferred insulin for 30  day supply:  -Lantus Solostar insulin pen ($50) -Tresiba Flex pen ($50)    Hypoglycemia Hypoglycemia is when the sugar (glucose) level in your blood is too low. Signs of low blood sugar may include:  Feeling: ? Hungry. ? Worried or nervous (anxious). ? Sweaty and clammy. ? Confused. ? Dizzy. ? Sleepy. ? Sick to your stomach (nauseous).  Having: ? A fast heartbeat. ? A headache. ? A change in your vision. ? Tingling or no feeling (numbness) around your mouth, lips, or tongue. ? Jerky  movements that you cannot control (seizure).  Having trouble with: ? Moving (coordination). ? Sleeping. ? Passing out (fainting). ? Getting upset easily (irritability). Low blood sugar can happen to people who have diabetes and people who do not have diabetes. Low blood sugar can happen quickly, and it can be an emergency. Treating low blood sugar Low blood sugar is often treated by eating or drinking something sugary right away, such as:  Fruit juice, 4-6 oz (120-150 mL).  Regular soda (not diet soda), 4-6 oz (120-150 mL).  Low-fat milk, 4 oz (120 mL).  Several pieces of hard candy.  Sugar or honey, 1 Tbsp (15 mL). Treating low blood sugar if you have diabetes If you can think clearly and swallow safely, follow the 15:15 rule:  Take 15 grams of a fast-acting carb (carbohydrate). Talk with your doctor about how much you should take.  Always keep a source of fast-acting carb with you, such as: ? Sugar tablets (glucose pills). Take 3-4 pills. ? 6-8 pieces of hard candy. ? 4-6 oz (120-150 mL) of fruit juice. ? 4-6 oz (120-150 mL) of regular (not diet) soda. ? 1 Tbsp (15 mL) honey or sugar.  Check your blood sugar 15 minutes after you take the carb.  If your blood sugar is still at or below 70 mg/dL (3.9 mmol/L), take 15 grams of a carb again.  If your blood sugar does not go above 70 mg/dL (3.9 mmol/L) after 3 tries, get help right away.  After your blood sugar goes back to normal, eat a meal or a snack within 1 hour.  Treating very low blood sugar If your blood sugar is at or below 54 mg/dL (3 mmol/L), you have very low blood sugar (severe hypoglycemia). This may also cause:  Passing out.  Jerky movements you cannot control (seizure).  Losing consciousness (coma). This is an emergency. Do not wait to see if the symptoms will go away. Get medical help right away. Call your local emergency services (911 in the [place] Do not drive yourself to the hospital. If you have very  low blood sugar and you cannot eat or drink, you may need a glucagon shot (injection). A family member or friend should learn how to check your blood sugar and how to give you a glucagon shot. Ask your doctor if you need to have a glucagon shot kit at home. Follow these instructions at home: General instructions  Take over-the-counter and prescription medicines only as told by your doctor.  Stay aware of your blood sugar as told by your doctor.  Limit alcohol intake to no more than 1 drink a day for nonpregnant women and 2 drinks a day for men. One drink equals 12 oz of beer (355 mL), 5 oz of wine (148 mL), or 1 oz of hard liquor (44 mL).  Keep all follow-up visits as told by your doctor. This is important. If you have diabetes:   Follow your diabetes care plan  as told by your doctor. Make sure you: ? Know the signs of low blood sugar. ? Take your medicines as told. ? Follow your exercise and meal plan. ? Eat on time. Do not skip meals. ? Check your blood sugar as often as told by your doctor. Always check it before and after exercise. ? Follow your sick day plan when you cannot eat or drink normally. Make this plan ahead of time with your doctor.  Share your diabetes care plan with: ? Your work or school. ? People you live with.  Check your pee (urine) for ketones: ? When you are sick. ? As told by your doctor.  Carry a card or wear jewelry that says you have diabetes. Contact a doctor if:  You have trouble keeping your blood sugar in your target range.  You have low blood sugar often. Get help right away if:  You still have symptoms after you eat or drink something sugary.  Your blood sugar is at or below 54 mg/dL (3 mmol/L).  You have jerky movements that you cannot control.  You pass out. These symptoms may be an emergency. Do not wait to see if the symptoms will go away. Get medical help right away. Call your local emergency services (911 in the [place] Do not drive  yourself to the hospital. Summary  Hypoglycemia happens when the level of sugar (glucose) in your blood is too low.  Low blood sugar can happen to people who have diabetes and people who do not have diabetes. Low blood sugar can happen quickly, and it can be an emergency.  Make sure you know the signs of low blood sugar and know how to treat it.  Always keep a source of sugar (fast-acting carb) with you to treat low blood sugar. This information is not intended to replace advice given to you by your health care provider. Make sure you discuss any questions you have with your health care provider. Document Revised: 08/31/2018 Document Reviewed: 06/13/2015 Elsevier Patient Education  [place]. Insulin Injection Instructions, Single Insulin Dose, Adult A subcutaneous injection is a shot of medicine that is injected into the layer of fat and tissue between skin and muscle. People with type 1 diabetes must take insulin because their bodies do not make it. People with type 2 diabetes may need to take insulin.  There are many different types of insulin. The type of insulin that you take may determine how many injections you give yourself and when you need to give the injections. Supplies needed:  Soap and water to wash hands.  A new, unused insulin syringe.  Your insulin medication bottle (vial).  Alcohol wipes.  A disposal container that is meant for sharp items (sharps container), such as an empty plastic bottle with a cover. How to choose a site for injection The body absorbs insulin differently, depending on where the insulin is injected (injection site). It is best to inject insulin into the same body area each time (for example, always in the abdomen), but you should use a different spot in that area for each injection. Do not inject the insulin in the same spot each time. There are five main areas that can be used for injecting. These areas include:  Abdomen. This is the  preferred area.  Front of thigh.  Upper, outer side of thigh.  Upper, outer side of arm.  Upper, outer part of buttock. How to give a single-dose insulin injection First, follow the steps for  Get ready, then continue with the steps for Push air into the vial, then follow the steps for Fill the syringe, and finish with the steps for Inject the insulin. Get ready 1. Wash your hands with soap and water. If soap and water are not available, use hand sanitizer. 2. Before you give yourself an insulin injection, be sure to test your blood sugar level (blood glucose level) and write down that number. Follow any instructions from your health care provider about what to do if your blood glucose level is higher or lower than your normal range. 3. Use a new, unused insulin syringe each time you need to inject insulin. 4. Check to make sure you have the correct type of insulin syringe for the concentration of insulin that you are using. 5. Check the expiration date and the type of insulin that you are using. 6. If you are using CLEAR insulin, check to see that it is clear and free of clumps. 7. Do not shake the vial to get it ready. Gently roll the vial between your palms several times. 8. Remove the plastic pop-top covering from the vial of insulin. This type of covering is present on a vial when it is new. 9. Use an alcohol wipe to clean the rubber top of the vial. 10. Remove the plastic cover from the syringe needle. Do not let the needle touch anything. Push air into the vial 1. To bring (draw up) air into the syringe, slowly pull back on the syringe plunger. Stop pulling the plunger when the dose indicator gets to the number of units that you will be using. 2. While you keep the vial right-side-up, poke the needle through the rubber top of the vial. Do not turn the vial upside down to do this. 3. Push the plunger all the way into the syringe. Doing that will push air into the vial. 4. Do not take  the needle out of the vial yet. Fill the syringe  1. While the needle is still in the vial, turn the vial upside down and hold it at eye level. 2. Slowly pull back on the plunger. Stop pulling the plunger when the dose indicator gets to the desired number of units. 3. If you see air bubbles in the syringe, slowly move the plunger up and down 2 or 3 times to make them go away. ? If you had to move the plunger to get rid of air bubbles, pull back the plunger until the dose indicator returns to the correct dose. 4. Remove the needle from the vial. Do not let the needle touch anything. Inject the insulin  1. Use an alcohol wipe to clean the site where you will be injecting the needle. Let the site air-dry. 2. Hold the syringe in your writing hand like a pencil. 3. Use your other hand to pinch and hold about an inch (2.5 cm) of skin. Do not directly touch the cleaned part of the skin. 4. Gently but quickly, put the needle straight into the skin. The needle should be at a 90-degree angle (perpendicular) to the skin. 5. Push the needle in as far as it will go (to the hub). 6. When the needle is completely inserted into the skin, use your thumb or index finger of your writing hand to push the plunger all the way into the syringe to inject the insulin. 7. Let go of the skin that you are pinching. Continue to hold the syringe in place with your writing  hand. 8. Wait 10 seconds, then pull the needle straight out of the skin. This will allow all of the insulin to go from the syringe and needle into your body. 9. Press and hold the alcohol wipe over the injection site until any bleeding stops. Do not rub the area. 10. Do not put the plastic cover back on the needle. 11. Discard the syringe and needle directly into a sharps container, such as an empty plastic bottle with a cover. How to throw away supplies  Discard all used needles in a puncture-proof sharps disposal container. You can ask your local  pharmacy about where you can get this kind of disposal container, or you can use an empty plastic liquid laundry detergent bottle that has a cover.  Follow the disposal regulations for the area where you live. Do not use any syringe or needle more than one time.  Throw away empty vials in the regular trash. Questions to ask your health care provider  How often should I be taking insulin?  How often should I check my blood glucose?  What amount of insulin should I be taking at each time?  What are the side effects?  What should I do if my blood glucose is too high?  What should I do if my blood glucose is too low?  What should I do if I forget to take my insulin?  What number should I call if I have questions? Where to find more information  American Diabetes Association (ADA): www.diabetes.org  American Association of Diabetes Educators (AADE) Patient Resources: https://www.diabeteseducator.org Summary  A subcutaneous injection is a shot of medicine that is injected into the layer of fat and tissue between skin and muscle.  Before you give yourself an insulin injection, be sure to test your blood sugar level (blood glucose level) and write down that number.  The type of insulin that you take may determine how many injections you give yourself and when you need to give the injections.  Check the expiration date and the type of insulin that you are using.  It is best to inject insulin into the same body area each time (for example, always in the abdomen), but you should use a different spot in that area for each injection. This information is not intended to replace advice given to you by your health care provider. Make sure you discuss any questions you have with your health care provider. Document Revised: 05/13/2017 Document Reviewed: 06/13/2015 Elsevier Patient Education  [place]. Hemoglobin A1c Test Why am I having this test? You may have the hemoglobin A1c  test (HbA1c test) done to:  Evaluate your risk for developing diabetes (diabetes mellitus).  Diagnose diabetes.  Monitor long-term control of blood sugar (glucose) in people who have diabetes and help make treatment decisions. This test may be done with other blood glucose tests, such as fasting blood glucose and oral glucose tolerance tests. What is being tested? Hemoglobin is a type of protein in the blood that carries oxygen. Glucose attaches to hemoglobin to form glycated hemoglobin. This test checks the amount of glycated hemoglobin in your blood, which is a good indicator of the average amount of glucose in your blood during the past 2-3 months. What kind of sample is taken?  A blood sample is required for this test. It is usually collected by inserting a needle into a blood vessel. Tell a health care provider about:  All medicines you are taking, including vitamins, herbs, eye drops,  creams, and over-the-counter medicines.  Any blood disorders you have.  Any surgeries you have had.  Any medical conditions you have.  Whether you are pregnant or may be pregnant. How are the results reported? Your results will be reported as a percentage that indicates how much of your hemoglobin has glucose attached to it (is glycated). Your health care provider will compare your results to normal ranges that were established after testing a large group of people (reference ranges). Reference ranges may vary among labs and hospitals. For this test, common reference ranges are:  Adult or child without diabetes: 4-5.6%.  Adult or child with diabetes and good blood glucose control: less than 7%. What do the results mean? If you have diabetes:  A result of less than 7% is considered normal, meaning that your blood glucose is well controlled.  A result higher than 7% means that your blood glucose is not well controlled, and your treatment plan may need to be adjusted. If you do not have  diabetes:  A result within the reference range is considered normal, meaning that you are not at high risk for diabetes.  A result of 5.7-6.4% means that you have a high risk of developing diabetes, and you may have prediabetes. Prediabetes is the condition of having a blood glucose level that is higher than it should be, but not high enough for you to be diagnosed with diabetes. Having prediabetes puts you at risk for developing type 2 diabetes (type 2 diabetes mellitus). You may have more tests, including a repeat HbA1c test.  Results of 6.5% or higher on two separate HbA1c tests mean that you have diabetes. You may have more tests to confirm the diagnosis. Abnormally low HbA1c values may be caused by:  Pregnancy.  Severe blood loss.  Receiving donated blood (transfusions).  Low red blood cell count (anemia).  Long-term kidney failure.  Some unusual forms (variants) of hemoglobin. Talk with your health care provider about what your results mean. Questions to ask your health care provider Ask your health care provider, or the department that is doing the test:  When will my results be ready?  How will I get my results?  What are my treatment options?  What other tests do I need?  What are my next steps? Summary  The hemoglobin A1c test (HbA1c test) may be done to evaluate your risk for developing diabetes, to diagnose diabetes, and to monitor long-term control of blood sugar (glucose) in people who have diabetes and help make treatment decisions.  Hemoglobin is a type of protein in the blood that carries oxygen. Glucose attaches to hemoglobin to form glycated hemoglobin. This test checks the amount of glycated hemoglobin in your blood, which is a good indicator of the average amount of glucose in your blood during the past 2-3 months.  Talk with your health care provider about what your results mean. This information is not intended to replace advice given to you by your  health care provider. Make sure you discuss any questions you have with your health care provider. Document Revised: 04/22/2017 Document Reviewed: 12/21/2016 Elsevier Patient Education  [place]. Hyperglycemia Hyperglycemia occurs when the level of sugar (glucose) in the blood is too high. Glucose is a type of sugar that provides the body's main source of energy. Certain hormones (insulin and glucagon) control the level of glucose in the blood. Insulin lowers blood glucose, and glucagon increases blood glucose. Hyperglycemia can result from having too little insulin in  the bloodstream, or from the body not responding normally to insulin. Hyperglycemia occurs most often in people who have diabetes (diabetes mellitus), but it can happen in people who do not have diabetes. It can develop quickly, and it can be life-threatening if it causes you to become severely dehydrated (diabetic ketoacidosis or hyperglycemic hyperosmolar state). Severe hyperglycemia is a medical emergency. What are the causes? If you have diabetes, hyperglycemia may be caused by:  Diabetes medicine.  Medicines that increase blood glucose or affect your diabetes control.  Not eating enough, or not eating often enough.  Changes in physical activity level.  Being sick or having an infection. If you have prediabetes or undiagnosed diabetes:  Hyperglycemia may be caused by those conditions. If you do not have diabetes, hyperglycemia may be caused by:  Certain medicines, including steroid medicines, beta-blockers, epinephrine, and thiazide diuretics.  Stress.  Serious illness.  Surgery.  Diseases of the pancreas.  Infection. What increases the risk? Hyperglycemia is more likely to develop in people who have risk factors for diabetes, such as:  Having a family member with diabetes.  Having a gene for type 1 diabetes that is passed from parent to child (inherited).  Living in an area with cold weather  conditions.  Exposure to certain viruses.  Certain conditions in which the body's disease-fighting (immune) system attacks itself (autoimmune disorders).  Being overweight or obese.  Having an inactive (sedentary) lifestyle.  Having been diagnosed with insulin resistance.  Having a history of prediabetes, gestational diabetes, or polycystic ovarian syndrome (PCOS).  Being of American-Indian, African-American, Hispanic/Latino, or Asian/Pacific Islander descent. What are the signs or symptoms? Hyperglycemia may not cause any symptoms. If you do have symptoms, they may include early warning signs, such as:  Increased thirst.  Hunger.  Feeling very tired.  Needing to urinate more often than usual.  Blurry vision. Other symptoms may develop if hyperglycemia gets worse, such as:  Dry mouth.  Loss of appetite.  Fruity-smelling breath.  Weakness.  Unexpected or rapid weight gain or weight loss.  Tingling or numbness in the hands or feet.  Headache.  Skin that does not quickly return to normal after being lightly pinched and released (poor skin turgor).  Abdominal pain.  Cuts or bruises that are slow to heal. How is this diagnosed? Hyperglycemia is diagnosed with a blood test to measure your blood glucose level. This blood test is usually done while you are having symptoms. Your health care provider may also do a physical exam and review your medical history. You may have more tests to determine the cause of your hyperglycemia, such as:  A fasting blood glucose (FBG) test. You will not be allowed to eat (you will fast) for at least 8 hours before a blood sample is taken.  An A1c (hemoglobin A1c) blood test. This provides information about blood glucose control over the previous 2-3 months.  An oral glucose tolerance test (OGTT). This measures your blood glucose at two times: ? After fasting. This is your baseline blood glucose level. ? Two hours after drinking a  beverage that contains glucose. How is this treated? Treatment depends on the cause of your hyperglycemia. Treatment may include:  Taking medicine to regulate your blood glucose levels. If you take insulin or other diabetes medicines, your medicine or dosage may be adjusted.  Lifestyle changes, such as exercising more, eating healthier foods, or losing weight.  Treating an illness or infection, if this caused your hyperglycemia.  Checking your blood glucose  more often.  Stopping or reducing steroid medicines, if these caused your hyperglycemia. If your hyperglycemia becomes severe and it results in hyperglycemic hyperosmolar state, you must be hospitalized and given IV fluids. Follow these instructions at home:  General instructions  Take over-the-counter and prescription medicines only as told by your health care provider.  Do not use any products that contain nicotine or tobacco, such as cigarettes and e-cigarettes. If you need help quitting, ask your health care provider.  Limit alcohol intake to no more than 1 drink per day for nonpregnant women and 2 drinks per day for men. One drink equals 12 oz of beer, 5 oz of wine, or 1 oz of hard liquor.  Learn to manage stress. If you need help with this, ask your health care provider.  Keep all follow-up visits as told by your health care provider. This is important. Eating and drinking   Maintain a healthy weight.  Exercise regularly, as directed by your health care provider.  Stay hydrated, especially when you exercise, get sick, or spend time in hot temperatures.  Eat healthy foods, such as: ? Lean proteins. ? Complex carbohydrates. ? Fresh fruits and vegetables. ? Low-fat dairy products. ? Healthy fats.  Drink enough fluid to keep your urine clear or pale yellow. If you have diabetes:  Make sure you know the symptoms of hyperglycemia.  Follow your diabetes management plan, as told by your health care provider. Make  sure you: ? Take your insulin and medicines as directed. ? Follow your exercise plan. ? Follow your meal plan. Eat on time, and do not skip meals. ? Check your blood glucose as often as directed. Make sure to check your blood glucose before and after exercise. If you exercise longer or in a different way than usual, check your blood glucose more often. ? Follow your sick day plan whenever you cannot eat or drink normally. Make this plan in advance with your health care provider.  Share your diabetes management plan with people in your workplace, school, and household.  Check your urine for ketones when you are ill and as told by your health care provider.  Carry a medical alert card or wear medical alert jewelry. Contact a health care provider if:  Your blood glucose is at or above 240 mg/dL (13.3 mmol/L) for 2 days in a row.  You have problems keeping your blood glucose in your target range.  You have frequent episodes of hyperglycemia. Get help right away if:  You have difficulty breathing.  You have a change in how you think, feel, or act (mental status).  You have nausea or vomiting that does not go away. These symptoms may represent a serious problem that is an emergency. Do not wait to see if the symptoms will go away. Get medical help right away. Call your local emergency services (911 in the [place] Do not drive yourself to the hospital. Summary  Hyperglycemia occurs when the level of sugar (glucose) in the blood is too high.  Hyperglycemia is diagnosed with a blood test to measure your blood glucose level. This blood test is usually done while you are having symptoms. Your health care provider may also do a physical exam and review your medical history.  If you have diabetes, follow your diabetes management plan as told by your health care provider.  Contact your health care provider if you have problems keeping your blood glucose in your target range. This information is  not intended to replace  advice given to you by your health care provider. Make sure you discuss any questions you have with your health care provider. Document Revised: 01/26/2016 Document Reviewed: 01/26/2016 Elsevier Patient Education  [place].

## 2019-08-17 NOTE — Progress Notes (Signed)
MOBILITY TEAM - Progress Note   08/17/19 1000  Mobility  Activity Ambulated in hall  Level of Assistance Independent  Assistive Device None  Distance Ambulated (ft) 470 ft  Mobility Response Tolerated well  Mobility performed by Mobility specialist  Bed Position Semi-fowlers   Pt motivated for d/c home today.  Ina Homes, PT, DPT Mobility Team Pager [phone]

## 2019-08-17 NOTE — Discharge Summary (Signed)
Discharge Summary  Patient ID: Olivia [name] [id number] 59 y.o. 02-25-1961  Admit date: 08/12/2019  Discharge date and time: 08/17/2019 10:42 AM   Admitting Physician: Elam Dutch, MD   Discharge Physician: Dr. Annamarie Major  Admission Diagnoses: Inguinal abscess [L02.214] Abscess of left groin [L02.214]  Discharge Diagnoses: Abscess left groin   Admission Condition: fair  Discharged Condition: good  Indication for Admission: Abscess of left groin with possible infection of segment of aortobifemoral bypass graft  Hospital Course: Olivia [name] is a 59 y.o. female history of diabetes, CAD, hypertension, aorto-bifemoral bypass graft in 2018 by Dr. Trula Slade.  She presented to the ED on 08/12/19 with several day history of anorexia and malaise.  Over several days she had increased swelling and pain in left groin region.  Pt with prior aortobifem by Dr Trula Slade 2018.  Postoperartively there was a left groin seroma which improved and pt states this has now recurred and gotten worse. Pt husband thinks she had fever  but did not take temperature. In the ED she was started on Vancomycin and Rocephin empirically. She had WBC count of 13.1, hyperglycemia, AKI, and low grade fever. CT abdomen pelvis large fluid collection with surrounding soft tissue stranding 5 cm fluid collection left groin. She was taken to the OR on 3/21/21for incision and drainage of left groin. Aerobic and anaerobic cultures of abscess were obtained intraoperatively.  POD#1 she was feeling much better with more energy and less groin pain.Wound packing was changed with wound appearing well. Cultures pending so she was continued on Rocephin and Vancomycin. She was slightly hypotensive overnight and her home blood pressure medications were held. She was tolerating diet and ambulating in her room with assistance  POD#2 she continued to appear well. Wound adequately healing. Continued Packing changes. Cultures positive for  MSSA.Continued to monitor patient for signs of possible graft involvement but she remained afebrile IV antibiotics continued. Continued mobilization  POD#3 packing changed with wound healing appropriately. Per pharmacy IV antibiotics narrowed to Cefazolin. She continued to mobilize well and independently. Remained afebrile.  POD#4 adequate healing of left groin wound. Mobilization and continued IV antibiotics. Remained afebrile  POD#5 wound appears well. Remains without obvious signs of graft involvement. Patient without pain. Home health arranged for daily packing changes. Patient with discharge home today on Keflex for 6 weeks. She will follow up in the office in 2-3 weeks to observe wound healing  Consults: None  Treatments: antibiotics: Ancef, vancomycin and cefepime and surgery: I&D left groin abscess  Vitals:   08/17/19 0448 08/17/19 0744  BP: (!) 148/77 (!) 157/96  Pulse:  77  Resp: (!) 21 18  Temp: (!) 100.5 F (38.1 C) 98.1 F (36.7 C)  SpO2: 97% 100%    Disposition: Discharge disposition: 01-Home or Self Care       Patient Instructions:  Allergies as of 08/17/2019   No Known Allergies     Medication List    TAKE these medications   aspirin EC 81 MG tablet Take 81 mg by mouth every evening.   cephALEXin 500 MG capsule Commonly known as: KEFLEX Take 1 capsule (500 mg total) by mouth 4 (four) times daily.   gabapentin 400 MG capsule Commonly known as: NEURONTIN Take 400 mg by mouth 3 (three) times daily.   glipiZIDE 10 MG tablet Commonly known as: GLUCOTROL Take 10 mg by mouth daily before breakfast.   GOODY HEADACHE PO Take 1 packet by mouth daily as needed (headaches).   Januvia  100 MG tablet Generic drug: sitaGLIPtin Take 100 mg by mouth daily.   Jardiance 25 MG Tabs tablet Generic drug: empagliflozin Take 25 mg by mouth every evening.   levothyroxine 25 MCG tablet Commonly known as: SYNTHROID Take 25 mcg by mouth daily before breakfast.     lisinopril 5 MG tablet Commonly known as: ZESTRIL Take 1 tablet (5 mg total) by mouth daily.   metFORMIN 1000 MG tablet Commonly known as: GLUCOPHAGE Take 1,000 mg by mouth 2 (two) times daily with a meal.   metoprolol tartrate 50 MG tablet Commonly known as: LOPRESSOR TAKE ONE TABLET BY MOUTH TWICE DAILY   nicotine 21 mg/24hr patch Commonly known as: NICODERM CQ - dosed in mg/24 hours Place 1 patch (21 mg total) onto the skin daily.   nitroGLYCERIN 0.4 MG SL tablet Commonly known as: NITROSTAT Place 1 tablet (0.4 mg total) under the tongue every 5 (five) minutes as needed for chest pain.   oxyCODONE-acetaminophen 7.5-325 MG tablet Commonly known as: Percocet Take 1 tablet by mouth every 4 (four) hours as needed for severe pain.   pravastatin 40 MG tablet Commonly known as: PRAVACHOL Take 40 mg by mouth daily.      Activity: activity as tolerated and no driving while on analgesics Diet: diabetic diet Wound Care: Daily packing changes wet- to -dry and reinforce as needed  Follow-up with Dr. Myra Gianotti in 2-3 weeks for wound evaluation   Signed: Graceann Congress, PA-C 08/17/2019 12:37 PM [facility] Office: [phone]

## 2019-09-07 ENCOUNTER — Telehealth (HOSPITAL_COMMUNITY): Payer: Self-pay

## 2019-09-07 NOTE — Telephone Encounter (Signed)

## 2019-09-10 ENCOUNTER — Ambulatory Visit (INDEPENDENT_AMBULATORY_CARE_PROVIDER_SITE_OTHER): Payer: Self-pay | Admitting: Physician Assistant

## 2019-09-10 ENCOUNTER — Other Ambulatory Visit: Payer: Self-pay

## 2019-09-10 VITALS — BP 134/67 | HR 68 | Temp 97.8°F | Resp 16 | Ht 63.0 in | Wt 184.0 lb

## 2019-09-10 DIAGNOSIS — L02214 Cutaneous abscess of groin: Secondary | ICD-10-CM

## 2019-09-10 NOTE — Progress Notes (Signed)
  POST OPERATIVE OFFICE NOTE    CC:  F/u for surgery  HPI:  This is a 59 y.o. female who is s/p incision and drainage of left groin abscess.  She states her husband is continuing to do local wound care daily.  She denies fever or chills.  She is compliant with cephalexin and says she states "another bottle" left to take.  Lower extremity claudication or rest pain.  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  She also has a history of carotid artery stenosis and is followed by Dr. Gery Pray.    No Known Allergies  Current Outpatient Medications  Medication Sig Dispense Refill  . aspirin EC 81 MG tablet Take 81 mg by mouth every evening.     . Aspirin-Acetaminophen-Caffeine (GOODY HEADACHE PO) Take 1 packet by mouth daily as needed (headaches).    . cephALEXin (KEFLEX) 500 MG capsule Take 1 capsule (500 mg total) by mouth 4 (four) times daily. 168 capsule 0  . gabapentin (NEURONTIN) 400 MG capsule Take 400 mg by mouth 3 (three) times daily.    Marland Kitchen glipiZIDE (GLUCOTROL) 10 MG tablet Take 10 mg by mouth daily before breakfast.    . JANUVIA 100 MG tablet Take 100 mg by mouth daily.    Marland Kitchen JARDIANCE 25 MG TABS tablet Take 25 mg by mouth every evening.     Marland Kitchen levothyroxine (SYNTHROID, LEVOTHROID) 25 MCG tablet Take 25 mcg by mouth daily before breakfast.    . lisinopril (PRINIVIL,ZESTRIL) 5 MG tablet Take 1 tablet (5 mg total) by mouth daily.    . metFORMIN (GLUCOPHAGE) 1000 MG tablet Take 1,000 mg by mouth 2 (two) times daily with a meal.    . metoprolol (LOPRESSOR) 50 MG tablet TAKE ONE TABLET BY MOUTH TWICE DAILY (Patient taking differently: Take 50 mg by mouth 2 (two) times daily. ) 180 tablet 3  . nicotine (NICODERM CQ - DOSED IN MG/24 HOURS) 21 mg/24hr patch Place 1 patch (21 mg total) onto the skin daily. 28 patch 1  . nitroGLYCERIN (NITROSTAT) 0.4 MG SL tablet Place 1 tablet (0.4 mg total) under the tongue every 5 (five) minutes as needed  for chest pain. 25 tablet 3  . oxyCODONE-acetaminophen (PERCOCET) 7.5-325 MG tablet Take 1 tablet by mouth every 4 (four) hours as needed for severe pain. 16 tablet 0  . pravastatin (PRAVACHOL) 40 MG tablet Take 40 mg by mouth daily.      No current facility-administered medications for this visit.     ROS:  See HPI  Physical Exam:  Vitals:   09/10/19 1323  Weight: 184 lb (83.5 kg)  Height: 5\' 3"  (1.6 m)    Incision: Inspection of the patient's left groin reveals an approximately 1 cm x 1/2 cm granulating wound with no appreciable depth. Extremities: Both feet are warm and well-perfused.  Intact sensation.  Active range of motion. Neuro: Oriented x4.   Assessment/Plan:  This is a 59 y.o. female who is s/p: Left groin incision and drainage.  Wound is healing in nicely.  Continue local wound care.  She may return to work and resume normal activities.  Status post aortobifemoral bypass 3 years ago.  We will make arrangements to update her ABIs.  She is currently asymptomatic   45, PA-C [facility] [phone]  Clinic MD: [phone]

## 2019-09-12 ENCOUNTER — Other Ambulatory Visit: Payer: Self-pay | Admitting: *Deleted

## 2019-09-12 DIAGNOSIS — I7409 Other arterial embolism and thrombosis of abdominal aorta: Secondary | ICD-10-CM

## 2019-10-31 ENCOUNTER — Ambulatory Visit: Payer: BC Managed Care – PPO | Admitting: Cardiology

## 2019-11-19 ENCOUNTER — Ambulatory Visit: Payer: BC Managed Care – PPO | Admitting: Cardiovascular Disease

## 2019-11-28 ENCOUNTER — Ambulatory Visit: Payer: BC Managed Care – PPO | Admitting: Cardiovascular Disease

## 2019-12-18 ENCOUNTER — Ambulatory Visit: Payer: BC Managed Care – PPO | Admitting: Cardiology
